# Patient Record
Sex: Female | Born: 1980 | Race: Asian | Hispanic: No | Marital: Married | Smoking: Never smoker
Health system: Southern US, Community
[De-identification: ages and names within clinical notes are randomized; demographics above are authoritative.]

## PROBLEM LIST (undated history)

## (undated) DIAGNOSIS — Z789 Other specified health status: Secondary | ICD-10-CM

## (undated) HISTORY — PX: NO PAST SURGERIES: SHX2092

---

## 2014-04-05 ENCOUNTER — Telehealth: Payer: Self-pay | Admitting: Neurology

## 2014-04-06 NOTE — Telephone Encounter (Signed)
error 

## 2014-04-29 ENCOUNTER — Telehealth: Payer: Self-pay | Admitting: Neurology

## 2014-04-29 ENCOUNTER — Encounter: Payer: Self-pay | Admitting: *Deleted

## 2014-04-29 NOTE — Telephone Encounter (Signed)
Pt states that she is returning Dr Allena KatzPatel call about MRI results please call [phone]

## 2014-04-29 NOTE — Telephone Encounter (Signed)
Patient calling you back 

## 2014-04-29 NOTE — Telephone Encounter (Signed)
Incorrect patient chart

## 2014-04-29 NOTE — Telephone Encounter (Signed)
This encounter was created in error - please disregard.

## 2015-02-10 MED FILL — LO LOESTRIN FE 1-10 TABLET: 1 MG-10 MCG | 84 days supply | Qty: 84 | Fill #1

## 2015-04-28 MED FILL — LO LOESTRIN FE 1-10 TABLET: 1 MG-10 MCG | 84 days supply | Qty: 84 | Fill #2

## 2015-08-11 MED FILL — LO LOESTRIN FE 1-10 TABLET: 1 MG-10 MCG | 84 days supply | Qty: 84 | Fill #3

## 2015-11-23 DIAGNOSIS — Z319 Encounter for procreative management, unspecified: Secondary | ICD-10-CM | POA: Diagnosis not present

## 2015-11-23 DIAGNOSIS — Z6822 Body mass index (BMI) 22.0-22.9, adult: Secondary | ICD-10-CM | POA: Diagnosis not present

## 2015-11-23 DIAGNOSIS — Z3169 Encounter for other general counseling and advice on procreation: Secondary | ICD-10-CM | POA: Diagnosis not present

## 2015-11-23 DIAGNOSIS — Z01419 Encounter for gynecological examination (general) (routine) without abnormal findings: Secondary | ICD-10-CM | POA: Diagnosis not present

## 2015-11-24 MED FILL — LO LOESTRIN FE 1-10 TABLET: 1 MG-10 MCG | 84 days supply | Qty: 84 | Fill #0

## 2015-11-28 ENCOUNTER — Telehealth: Payer: Self-pay | Admitting: Neurology

## 2015-11-28 NOTE — Telephone Encounter (Signed)
Sorry it is on patient [phone]

## 2015-11-28 NOTE — Telephone Encounter (Signed)
What patient?

## 2015-11-28 NOTE — Telephone Encounter (Signed)
Patient wants to know the status of the Lumbar puncture please call [phone]

## 2016-02-28 MED FILL — LO LOESTRIN FE 1-10 TABLET: 1 MG-10 MCG | 84 days supply | Qty: 84 | Fill #1

## 2016-04-03 DIAGNOSIS — J45990 Exercise induced bronchospasm: Secondary | ICD-10-CM | POA: Diagnosis not present

## 2016-04-03 DIAGNOSIS — Z23 Encounter for immunization: Secondary | ICD-10-CM | POA: Diagnosis not present

## 2016-04-03 DIAGNOSIS — Z0000 Encounter for general adult medical examination without abnormal findings: Secondary | ICD-10-CM | POA: Diagnosis not present

## 2016-05-17 MED FILL — LO LOESTRIN FE 1-10 TABLET: 1 MG-10 MCG | 84 days supply | Qty: 84 | Fill #2

## 2016-08-07 MED FILL — LO LOESTRIN FE 1-10 TABLET: 1 MG-10 MCG | 84 days supply | Qty: 84 | Fill #3

## 2017-05-08 DIAGNOSIS — H52203 Unspecified astigmatism, bilateral: Secondary | ICD-10-CM | POA: Diagnosis not present

## 2017-05-08 DIAGNOSIS — H5213 Myopia, bilateral: Secondary | ICD-10-CM | POA: Diagnosis not present

## 2017-05-29 DIAGNOSIS — Z6822 Body mass index (BMI) 22.0-22.9, adult: Secondary | ICD-10-CM | POA: Diagnosis not present

## 2017-05-29 DIAGNOSIS — Z01419 Encounter for gynecological examination (general) (routine) without abnormal findings: Secondary | ICD-10-CM | POA: Diagnosis not present

## 2017-09-23 ENCOUNTER — Telehealth: Payer: Self-pay | Admitting: Neurology

## 2018-03-05 DIAGNOSIS — N979 Female infertility, unspecified: Secondary | ICD-10-CM | POA: Diagnosis not present

## 2018-03-05 MED FILL — CLOMIPHENE CITRATE 50 MG TA: 50 | 5 days supply | Qty: 5 | Fill #0

## 2018-03-17 MED FILL — CLOMIPHENE CITRATE 50 MG TA: 50 | 5 days supply | Qty: 5 | Fill #0

## 2018-03-18 ENCOUNTER — Other Ambulatory Visit: Payer: Self-pay | Admitting: Obstetrics and Gynecology

## 2018-03-18 ENCOUNTER — Other Ambulatory Visit (HOSPITAL_COMMUNITY): Payer: Self-pay | Admitting: Obstetrics and Gynecology

## 2018-03-18 DIAGNOSIS — Z3141 Encounter for fertility testing: Secondary | ICD-10-CM

## 2018-03-26 ENCOUNTER — Encounter (HOSPITAL_COMMUNITY): Payer: Self-pay | Admitting: Radiology

## 2018-03-26 ENCOUNTER — Ambulatory Visit (HOSPITAL_COMMUNITY)
Admission: RE | Admit: 2018-03-26 | Discharge: 2018-03-26 | Disposition: A | Discharge status: Home / Self Care | Payer: 59 | Source: Ambulatory Visit | Attending: Obstetrics and Gynecology | Admitting: Obstetrics and Gynecology

## 2018-03-26 DIAGNOSIS — Z3141 Encounter for fertility testing: Secondary | ICD-10-CM | POA: Diagnosis not present

## 2018-03-26 MED ORDER — IOPAMIDOL (ISOVUE-300) INJECTION 61%
50.0000 mL | Freq: Once | INTRAVENOUS | Status: AC | PRN
  Administered 2018-03-26: 20 mL via INTRAVENOUS

## 2018-03-28 DIAGNOSIS — N979 Female infertility, unspecified: Secondary | ICD-10-CM | POA: Diagnosis not present

## 2018-04-07 DIAGNOSIS — N979 Female infertility, unspecified: Secondary | ICD-10-CM | POA: Diagnosis not present

## 2018-04-15 MED FILL — CLOMIPHENE CITRATE 50 MG TA: 50 | 5 days supply | Qty: 10 | Fill #0

## 2018-04-21 DIAGNOSIS — L723 Sebaceous cyst: Secondary | ICD-10-CM | POA: Diagnosis not present

## 2018-04-23 DIAGNOSIS — N83201 Unspecified ovarian cyst, right side: Secondary | ICD-10-CM | POA: Diagnosis not present

## 2018-04-23 DIAGNOSIS — N979 Female infertility, unspecified: Secondary | ICD-10-CM | POA: Diagnosis not present

## 2018-04-28 DIAGNOSIS — N979 Female infertility, unspecified: Secondary | ICD-10-CM | POA: Diagnosis not present

## 2018-05-02 DIAGNOSIS — N979 Female infertility, unspecified: Secondary | ICD-10-CM | POA: Diagnosis not present

## 2018-05-23 DIAGNOSIS — N979 Female infertility, unspecified: Secondary | ICD-10-CM | POA: Diagnosis not present

## 2018-06-02 DIAGNOSIS — N979 Female infertility, unspecified: Secondary | ICD-10-CM | POA: Diagnosis not present

## 2018-06-18 DIAGNOSIS — E288 Other ovarian dysfunction: Secondary | ICD-10-CM | POA: Diagnosis not present

## 2018-06-18 DIAGNOSIS — Z31430 Encounter of female for testing for genetic disease carrier status for procreative management: Secondary | ICD-10-CM | POA: Diagnosis not present

## 2018-06-18 DIAGNOSIS — N979 Female infertility, unspecified: Secondary | ICD-10-CM | POA: Diagnosis not present

## 2018-06-18 DIAGNOSIS — Z319 Encounter for procreative management, unspecified: Secondary | ICD-10-CM | POA: Diagnosis not present

## 2018-06-18 DIAGNOSIS — N83202 Unspecified ovarian cyst, left side: Secondary | ICD-10-CM | POA: Diagnosis not present

## 2018-06-18 DIAGNOSIS — N83201 Unspecified ovarian cyst, right side: Secondary | ICD-10-CM | POA: Diagnosis not present

## 2018-06-25 DIAGNOSIS — E288 Other ovarian dysfunction: Secondary | ICD-10-CM | POA: Diagnosis not present

## 2018-06-25 DIAGNOSIS — N979 Female infertility, unspecified: Secondary | ICD-10-CM | POA: Diagnosis not present

## 2018-06-25 DIAGNOSIS — Z3202 Encounter for pregnancy test, result negative: Secondary | ICD-10-CM | POA: Diagnosis not present

## 2018-06-25 DIAGNOSIS — Z3141 Encounter for fertility testing: Secondary | ICD-10-CM | POA: Diagnosis not present

## 2018-06-25 DIAGNOSIS — Z319 Encounter for procreative management, unspecified: Secondary | ICD-10-CM | POA: Diagnosis not present

## 2018-06-25 DIAGNOSIS — Z31430 Encounter of female for testing for genetic disease carrier status for procreative management: Secondary | ICD-10-CM | POA: Diagnosis not present

## 2018-07-16 DIAGNOSIS — Z31430 Encounter of female for testing for genetic disease carrier status for procreative management: Secondary | ICD-10-CM | POA: Diagnosis not present

## 2018-07-16 DIAGNOSIS — E288 Other ovarian dysfunction: Secondary | ICD-10-CM | POA: Diagnosis not present

## 2018-07-16 DIAGNOSIS — Z319 Encounter for procreative management, unspecified: Secondary | ICD-10-CM | POA: Diagnosis not present

## 2018-07-16 DIAGNOSIS — N979 Female infertility, unspecified: Secondary | ICD-10-CM | POA: Diagnosis not present

## 2018-07-23 DIAGNOSIS — E2839 Other primary ovarian failure: Secondary | ICD-10-CM | POA: Diagnosis not present

## 2018-07-23 DIAGNOSIS — N83292 Other ovarian cyst, left side: Secondary | ICD-10-CM | POA: Diagnosis not present

## 2018-07-23 DIAGNOSIS — N83291 Other ovarian cyst, right side: Secondary | ICD-10-CM | POA: Diagnosis not present

## 2018-07-23 DIAGNOSIS — Z113 Encounter for screening for infections with a predominantly sexual mode of transmission: Secondary | ICD-10-CM | POA: Diagnosis not present

## 2018-07-29 MED FILL — BD NEEDLES 22GX1.5": 22G X 1-1/2 | 30 days supply | Qty: 30 | Fill #0

## 2018-07-29 MED FILL — BD NEEDLES 30GX0.5": 30G X 1/2" | 30 days supply | Qty: 30 | Fill #0

## 2018-07-29 MED FILL — BD NEEDLES 22GX1.5: 22G X 1-1/2 | 30 days supply | Qty: 30 | Fill #0

## 2018-07-29 MED FILL — ESTRADIOL 2 MG TABLET: 2 | 30 days supply | Qty: 60 | Fill #0

## 2018-07-29 MED FILL — BD 3 ML SYRINGE 18GX1-1/2": 18G X 1-1/2 | 30 days supply | Qty: 60 | Fill #0

## 2018-07-29 MED FILL — ESTRADIOL 0.1 MG PATCH: 0.1 | 28 days supply | Qty: 8 | Fill #0

## 2018-07-29 MED FILL — BD NEEDLES 30GX0.5: 30G X 1/2" | 30 days supply | Qty: 30 | Fill #0

## 2018-07-29 MED FILL — GONAL-F RFF REDI-JECT 900 U: 900 | 4 days supply | Qty: 6 | Fill #0

## 2018-07-29 MED FILL — BD 3 ML SYRINGE 18GX1-1/2: 18G X 1-1/2 | 30 days supply | Qty: 60 | Fill #0

## 2018-07-29 MED FILL — CETROTIDE 0.25 MG KIT: 0.25 | 5 days supply | Qty: 5 | Fill #0

## 2018-07-29 MED FILL — NOVAREL 10,000 UNITS VIAL: 10000 | 1 days supply | Qty: 1 | Fill #0

## 2018-07-29 MED FILL — PROGESTERONE OIL 50 MG/ML V: 50 | 30 days supply | Qty: 30 | Fill #0

## 2018-07-29 MED FILL — METHYLPREDNISOLONE 4 MG TAB: 4 | 4 days supply | Qty: 16 | Fill #0

## 2018-07-29 MED FILL — DOXYCYCLINE HYCLATE 100 MG: 100 | 20 days supply | Qty: 40 | Fill #0

## 2018-07-29 MED FILL — MENOPUR 75 UNIT VIAL: 75 | 10 days supply | Qty: 10 | Fill #0

## 2018-08-09 DIAGNOSIS — Z31430 Encounter of female for testing for genetic disease carrier status for procreative management: Secondary | ICD-10-CM | POA: Diagnosis not present

## 2018-08-09 DIAGNOSIS — N83201 Unspecified ovarian cyst, right side: Secondary | ICD-10-CM | POA: Diagnosis not present

## 2018-08-09 DIAGNOSIS — Z3202 Encounter for pregnancy test, result negative: Secondary | ICD-10-CM | POA: Diagnosis not present

## 2018-08-09 DIAGNOSIS — N979 Female infertility, unspecified: Secondary | ICD-10-CM | POA: Diagnosis not present

## 2018-08-09 DIAGNOSIS — N83291 Other ovarian cyst, right side: Secondary | ICD-10-CM | POA: Diagnosis not present

## 2018-08-09 DIAGNOSIS — E288 Other ovarian dysfunction: Secondary | ICD-10-CM | POA: Diagnosis not present

## 2018-08-09 DIAGNOSIS — Z319 Encounter for procreative management, unspecified: Secondary | ICD-10-CM | POA: Diagnosis not present

## 2018-08-09 DIAGNOSIS — N971 Female infertility of tubal origin: Secondary | ICD-10-CM | POA: Diagnosis not present

## 2018-08-09 DIAGNOSIS — Z3183 Encounter for assisted reproductive fertility procedure cycle: Secondary | ICD-10-CM | POA: Diagnosis not present

## 2018-08-09 DIAGNOSIS — N83292 Other ovarian cyst, left side: Secondary | ICD-10-CM | POA: Diagnosis not present

## 2018-08-13 DIAGNOSIS — Z3183 Encounter for assisted reproductive fertility procedure cycle: Secondary | ICD-10-CM | POA: Diagnosis not present

## 2018-08-13 DIAGNOSIS — N979 Female infertility, unspecified: Secondary | ICD-10-CM | POA: Diagnosis not present

## 2018-08-13 DIAGNOSIS — E288 Other ovarian dysfunction: Secondary | ICD-10-CM | POA: Diagnosis not present

## 2018-08-16 DIAGNOSIS — Z3183 Encounter for assisted reproductive fertility procedure cycle: Secondary | ICD-10-CM | POA: Diagnosis not present

## 2018-08-16 DIAGNOSIS — N979 Female infertility, unspecified: Secondary | ICD-10-CM | POA: Diagnosis not present

## 2018-08-16 DIAGNOSIS — E288 Other ovarian dysfunction: Secondary | ICD-10-CM | POA: Diagnosis not present

## 2018-08-18 DIAGNOSIS — Z3183 Encounter for assisted reproductive fertility procedure cycle: Secondary | ICD-10-CM | POA: Diagnosis not present

## 2018-08-18 DIAGNOSIS — N979 Female infertility, unspecified: Secondary | ICD-10-CM | POA: Diagnosis not present

## 2018-08-18 DIAGNOSIS — E288 Other ovarian dysfunction: Secondary | ICD-10-CM | POA: Diagnosis not present

## 2018-08-18 MED FILL — MENOPUR 75 UNIT VIAL: 75 | 10 days supply | Qty: 10 | Fill #1

## 2018-08-18 MED FILL — GONAL-F RFF REDI-JECT 900 U: 900 | 4 days supply | Qty: 6 | Fill #1

## 2018-08-20 DIAGNOSIS — N979 Female infertility, unspecified: Secondary | ICD-10-CM | POA: Diagnosis not present

## 2018-08-20 DIAGNOSIS — E288 Other ovarian dysfunction: Secondary | ICD-10-CM | POA: Diagnosis not present

## 2018-08-20 DIAGNOSIS — Z3183 Encounter for assisted reproductive fertility procedure cycle: Secondary | ICD-10-CM | POA: Diagnosis not present

## 2018-08-20 DIAGNOSIS — Z113 Encounter for screening for infections with a predominantly sexual mode of transmission: Secondary | ICD-10-CM | POA: Diagnosis not present

## 2018-08-21 DIAGNOSIS — E288 Other ovarian dysfunction: Secondary | ICD-10-CM | POA: Diagnosis not present

## 2018-08-21 DIAGNOSIS — Z3183 Encounter for assisted reproductive fertility procedure cycle: Secondary | ICD-10-CM | POA: Diagnosis not present

## 2018-08-21 DIAGNOSIS — N979 Female infertility, unspecified: Secondary | ICD-10-CM | POA: Diagnosis not present

## 2018-08-22 NOTE — Telephone Encounter (Signed)
error 

## 2018-08-23 DIAGNOSIS — N858 Other specified noninflammatory disorders of uterus: Secondary | ICD-10-CM | POA: Diagnosis not present

## 2018-08-23 DIAGNOSIS — N971 Female infertility of tubal origin: Secondary | ICD-10-CM | POA: Diagnosis not present

## 2018-08-23 DIAGNOSIS — Z3183 Encounter for assisted reproductive fertility procedure cycle: Secondary | ICD-10-CM | POA: Diagnosis not present

## 2018-08-28 DIAGNOSIS — Z3183 Encounter for assisted reproductive fertility procedure cycle: Secondary | ICD-10-CM | POA: Diagnosis not present

## 2018-09-16 MED FILL — ESTRADIOL 0.1 MG PATCH: 0.1 | 28 days supply | Qty: 8 | Fill #1

## 2018-09-17 DIAGNOSIS — Z113 Encounter for screening for infections with a predominantly sexual mode of transmission: Secondary | ICD-10-CM | POA: Diagnosis not present

## 2018-09-17 DIAGNOSIS — N979 Female infertility, unspecified: Secondary | ICD-10-CM | POA: Diagnosis not present

## 2018-09-17 DIAGNOSIS — Z3183 Encounter for assisted reproductive fertility procedure cycle: Secondary | ICD-10-CM | POA: Diagnosis not present

## 2018-09-17 DIAGNOSIS — E288 Other ovarian dysfunction: Secondary | ICD-10-CM | POA: Diagnosis not present

## 2018-09-24 DIAGNOSIS — Z3183 Encounter for assisted reproductive fertility procedure cycle: Secondary | ICD-10-CM | POA: Diagnosis not present

## 2018-09-29 MED FILL — ESTRADIOL 2 MG TABS: 2 | 30 days supply | Qty: 60 | Fill #1

## 2018-10-02 DIAGNOSIS — Z3200 Encounter for pregnancy test, result unknown: Secondary | ICD-10-CM | POA: Diagnosis not present

## 2018-10-02 DIAGNOSIS — Z3201 Encounter for pregnancy test, result positive: Secondary | ICD-10-CM | POA: Diagnosis not present

## 2018-10-06 DIAGNOSIS — Z3201 Encounter for pregnancy test, result positive: Secondary | ICD-10-CM | POA: Diagnosis not present

## 2018-10-06 DIAGNOSIS — Z3200 Encounter for pregnancy test, result unknown: Secondary | ICD-10-CM | POA: Diagnosis not present

## 2018-10-07 MED FILL — BD NEEDLES 22GX1.5: 22G X 1-1/2 | 30 days supply | Qty: 30 | Fill #1

## 2018-10-07 MED FILL — PROGESTERONE OIL 50 MG/ML V: 50 | 30 days supply | Qty: 30 | Fill #1

## 2018-10-08 DIAGNOSIS — H5213 Myopia, bilateral: Secondary | ICD-10-CM | POA: Diagnosis not present

## 2018-10-08 MED FILL — ESTRADIOL 0.1 MG PATCH: 0.1 | 28 days supply | Qty: 8 | Fill #2

## 2018-10-21 DIAGNOSIS — Z3200 Encounter for pregnancy test, result unknown: Secondary | ICD-10-CM | POA: Diagnosis not present

## 2018-11-04 MED FILL — PROGESTERONE MICRONIZED 200: 200 | 14 days supply | Qty: 14 | Fill #0

## 2018-11-13 DIAGNOSIS — Z3481 Encounter for supervision of other normal pregnancy, first trimester: Secondary | ICD-10-CM | POA: Diagnosis not present

## 2018-11-13 DIAGNOSIS — Z3685 Encounter for antenatal screening for Streptococcus B: Secondary | ICD-10-CM | POA: Diagnosis not present

## 2018-11-18 DIAGNOSIS — O09511 Supervision of elderly primigravida, first trimester: Secondary | ICD-10-CM | POA: Diagnosis not present

## 2018-11-18 DIAGNOSIS — Z113 Encounter for screening for infections with a predominantly sexual mode of transmission: Secondary | ICD-10-CM | POA: Diagnosis not present

## 2018-11-18 DIAGNOSIS — Z3401 Encounter for supervision of normal first pregnancy, first trimester: Secondary | ICD-10-CM | POA: Diagnosis not present

## 2018-12-03 DIAGNOSIS — Z3682 Encounter for antenatal screening for nuchal translucency: Secondary | ICD-10-CM | POA: Diagnosis not present

## 2018-12-03 DIAGNOSIS — Z3A12 12 weeks gestation of pregnancy: Secondary | ICD-10-CM | POA: Diagnosis not present

## 2019-01-14 DIAGNOSIS — Z361 Encounter for antenatal screening for raised alphafetoprotein level: Secondary | ICD-10-CM | POA: Diagnosis not present

## 2019-01-14 DIAGNOSIS — Z3402 Encounter for supervision of normal first pregnancy, second trimester: Secondary | ICD-10-CM | POA: Diagnosis not present

## 2019-01-14 DIAGNOSIS — Z363 Encounter for antenatal screening for malformations: Secondary | ICD-10-CM | POA: Diagnosis not present

## 2019-01-14 DIAGNOSIS — Z3A18 18 weeks gestation of pregnancy: Secondary | ICD-10-CM | POA: Diagnosis not present

## 2019-03-11 DIAGNOSIS — Z0000 Encounter for general adult medical examination without abnormal findings: Secondary | ICD-10-CM | POA: Diagnosis not present

## 2019-03-11 DIAGNOSIS — Z331 Pregnant state, incidental: Secondary | ICD-10-CM | POA: Diagnosis not present

## 2019-03-11 DIAGNOSIS — J45990 Exercise induced bronchospasm: Secondary | ICD-10-CM | POA: Diagnosis not present

## 2019-03-19 DIAGNOSIS — Z3480 Encounter for supervision of other normal pregnancy, unspecified trimester: Secondary | ICD-10-CM | POA: Diagnosis not present

## 2019-03-19 DIAGNOSIS — Z3482 Encounter for supervision of other normal pregnancy, second trimester: Secondary | ICD-10-CM | POA: Diagnosis not present

## 2019-03-19 DIAGNOSIS — Z23 Encounter for immunization: Secondary | ICD-10-CM | POA: Diagnosis not present

## 2019-03-30 DIAGNOSIS — O99810 Abnormal glucose complicating pregnancy: Secondary | ICD-10-CM | POA: Diagnosis not present

## 2019-04-30 DIAGNOSIS — N760 Acute vaginitis: Secondary | ICD-10-CM | POA: Diagnosis not present

## 2019-04-30 DIAGNOSIS — Z3400 Encounter for supervision of normal first pregnancy, unspecified trimester: Secondary | ICD-10-CM | POA: Diagnosis not present

## 2019-05-14 DIAGNOSIS — Z3685 Encounter for antenatal screening for Streptococcus B: Secondary | ICD-10-CM | POA: Diagnosis not present

## 2019-05-14 LAB — OB RESULTS CONSOLE GBS: GBS: NEGATIVE

## 2019-05-31 ENCOUNTER — Inpatient Hospital Stay (HOSPITAL_COMMUNITY): Payer: 59 | Admitting: Anesthesiology

## 2019-05-31 ENCOUNTER — Inpatient Hospital Stay (HOSPITAL_COMMUNITY)
Admission: AD | Admit: 2019-05-31 | Discharge: 2019-06-04 | DRG: 786 | Disposition: A | Discharge status: Home / Self Care | Payer: 59 | Attending: Obstetrics and Gynecology | Admitting: Obstetrics and Gynecology

## 2019-05-31 ENCOUNTER — Other Ambulatory Visit: Payer: Self-pay

## 2019-05-31 ENCOUNTER — Encounter (HOSPITAL_COMMUNITY): Payer: Self-pay | Admitting: Obstetrics & Gynecology

## 2019-05-31 DIAGNOSIS — Z3A38 38 weeks gestation of pregnancy: Secondary | ICD-10-CM

## 2019-05-31 DIAGNOSIS — O4292 Full-term premature rupture of membranes, unspecified as to length of time between rupture and onset of labor: Secondary | ICD-10-CM | POA: Diagnosis present

## 2019-05-31 DIAGNOSIS — Z20822 Contact with and (suspected) exposure to covid-19: Secondary | ICD-10-CM | POA: Diagnosis present

## 2019-05-31 DIAGNOSIS — O411230 Chorioamnionitis, third trimester, not applicable or unspecified: Secondary | ICD-10-CM | POA: Diagnosis not present

## 2019-05-31 HISTORY — DX: Other specified health status: Z78.9

## 2019-05-31 LAB — CBC
HCT: 38.6 % (ref 36.0–46.0)
Hemoglobin: 12.4 g/dL (ref 12.0–15.0)
MCH: 30.4 pg (ref 26.0–34.0)
MCHC: 32.1 g/dL (ref 30.0–36.0)
MCV: 94.6 fL (ref 80.0–100.0)
Platelets: 172 10*3/uL (ref 150–400)
RBC: 4.08 MIL/uL (ref 3.87–5.11)
RDW: 13.1 % (ref 11.5–15.5)
WBC: 10.7 10*3/uL — ABNORMAL HIGH (ref 4.0–10.5)
nRBC: 0 % (ref 0.0–0.2)

## 2019-05-31 LAB — ABO/RH: ABO/RH(D): O POS

## 2019-05-31 LAB — TYPE AND SCREEN
ABO/RH(D): O POS
Antibody Screen: NEGATIVE

## 2019-05-31 LAB — SARS CORONAVIRUS 2 (TAT 6-24 HRS): SARS Coronavirus 2: NEGATIVE

## 2019-05-31 LAB — AMNISURE RUPTURE OF MEMBRANE (ROM) NOT AT ARMC: Amnisure ROM: POSITIVE

## 2019-05-31 LAB — POCT FERN TEST: POCT Fern Test: NEGATIVE

## 2019-05-31 MED ORDER — OXYCODONE-ACETAMINOPHEN 5-325 MG PO TABS: 1.0000 | ORAL_TABLET | ORAL | Status: DC | PRN

## 2019-05-31 MED ORDER — LACTATED RINGERS IV SOLN
500.0000 mL | Freq: Once | INTRAVENOUS | Status: AC
  Administered 2019-05-31: 500 mL via INTRAVENOUS

## 2019-05-31 MED ORDER — ONDANSETRON HCL 4 MG/2ML IJ SOLN
4.0000 mg | Freq: Four times a day (QID) | INTRAMUSCULAR | Status: DC | PRN
  Administered 2019-06-01 (×2): 4 mg via INTRAVENOUS
  Filled 2019-05-31: qty 2

## 2019-05-31 MED ORDER — LIDOCAINE HCL (PF) 1 % IJ SOLN: 30.0000 mL | INTRAMUSCULAR | Status: DC | PRN

## 2019-05-31 MED ORDER — TERBUTALINE SULFATE 1 MG/ML IJ SOLN: 0.2500 mg | Freq: Once | INTRAMUSCULAR | Status: DC | PRN

## 2019-05-31 MED ORDER — OXYTOCIN 40 UNITS IN NORMAL SALINE INFUSION - SIMPLE MED
2.5000 [IU]/h | INTRAVENOUS | Status: DC
  Administered 2019-06-01: 10:00:00 40 [IU]/h via INTRAVENOUS

## 2019-05-31 MED ORDER — SOD CITRATE-CITRIC ACID 500-334 MG/5ML PO SOLN
30.0000 mL | ORAL | Status: DC | PRN
  Administered 2019-06-01: 30 mL via ORAL
  Filled 2019-05-31: qty 30

## 2019-05-31 MED ORDER — LACTATED RINGERS IV SOLN: 500.0000 mL | INTRAVENOUS | Status: DC | PRN

## 2019-05-31 MED ORDER — DIPHENHYDRAMINE HCL 50 MG/ML IJ SOLN: 12.5000 mg | INTRAMUSCULAR | Status: DC | PRN

## 2019-05-31 MED ORDER — FENTANYL-BUPIVACAINE-NACL 0.5-0.125-0.9 MG/250ML-% EP SOLN
12.0000 mL/h | EPIDURAL | Status: DC | PRN
  Filled 2019-05-31: qty 250

## 2019-05-31 MED ORDER — LIDOCAINE HCL (PF) 1 % IJ SOLN
INTRAMUSCULAR | Status: DC | PRN
  Administered 2019-05-31: 7 mL via EPIDURAL
  Administered 2019-05-31: 3 mL via EPIDURAL

## 2019-05-31 MED ORDER — OXYCODONE-ACETAMINOPHEN 5-325 MG PO TABS: 2.0000 | ORAL_TABLET | ORAL | Status: DC | PRN

## 2019-05-31 MED ORDER — LACTATED RINGERS IV SOLN: INTRAVENOUS | Status: DC

## 2019-05-31 MED ORDER — OXYTOCIN BOLUS FROM INFUSION: 500.0000 mL | Freq: Once | INTRAVENOUS | Status: DC

## 2019-05-31 MED ORDER — ACETAMINOPHEN 325 MG PO TABS
650.0000 mg | ORAL_TABLET | ORAL | Status: DC | PRN
  Administered 2019-06-01: 650 mg via ORAL
  Filled 2019-05-31: qty 2

## 2019-05-31 MED ORDER — PHENYLEPHRINE 40 MCG/ML (10ML) SYRINGE FOR IV PUSH (FOR BLOOD PRESSURE SUPPORT): 80.0000 ug | PREFILLED_SYRINGE | INTRAVENOUS | Status: DC | PRN

## 2019-05-31 MED ORDER — PHENYLEPHRINE 40 MCG/ML (10ML) SYRINGE FOR IV PUSH (FOR BLOOD PRESSURE SUPPORT)
80.0000 ug | PREFILLED_SYRINGE | INTRAVENOUS | Status: DC | PRN
  Filled 2019-05-31: qty 10

## 2019-05-31 MED ORDER — EPHEDRINE 5 MG/ML INJ: 10.0000 mg | INTRAVENOUS | Status: DC | PRN

## 2019-05-31 MED ORDER — SODIUM CHLORIDE (PF) 0.9 % IJ SOLN
INTRAMUSCULAR | Status: DC | PRN
  Administered 2019-05-31: 12 mL/h via EPIDURAL

## 2019-05-31 MED ORDER — OXYTOCIN 40 UNITS IN NORMAL SALINE INFUSION - SIMPLE MED
1.0000 m[IU]/min | INTRAVENOUS | Status: DC
  Administered 2019-05-31: 2 m[IU]/min via INTRAVENOUS
  Filled 2019-05-31: qty 1000

## 2019-05-31 MED ORDER — FLEET ENEMA 7-19 GM/118ML RE ENEM: 1.0000 | ENEMA | RECTAL | Status: DC | PRN

## 2019-05-31 NOTE — Progress Notes (Signed)
SVE 3/80/-2, AROM forbag clear Will start pitocin.    Mitchel Honour, DO

## 2019-05-31 NOTE — MAU Note (Signed)
Elizabeth Cannon is a 39 y.o. at [redacted]w[redacted]d here in MAU reporting: today has been wearing a pad and noticed some water. States no gush just a little bit "here and there". States it was clear. Having some tightening in her abdomen but states they are not painful. +FM. No recent IC.  Onset of complaint: today  Pain score: 0/10  Vitals:   05/31/19 1553  BP: 116/75  Pulse: 88  Resp: 16  Temp: 98.6 F (37 C)  SpO2: 98%     FHT: +FM  Lab orders placed from triage: none

## 2019-05-31 NOTE — Progress Notes (Signed)

## 2019-05-31 NOTE — Anesthesia Procedure Notes (Signed)
Epidural Patient location during procedure: OB Start time: 05/31/2019 7:33 PM End time: 05/31/2019 7:43 PM  Staffing Anesthesiologist: Lucretia Kern, MD Performed: anesthesiologist   Preanesthetic Checklist Completed: patient identified, IV checked, risks and benefits discussed, monitors and equipment checked, pre-op evaluation and timeout performed  Epidural Patient position: sitting Prep: DuraPrep Patient monitoring: heart rate, continuous pulse ox and blood pressure Approach: midline Location: L3-L4 Injection technique: LOR air  Needle:  Needle type: Tuohy  Needle gauge: 17 G Needle length: 9 cm Needle insertion depth: 6 cm Catheter type: closed end flexible Catheter size: 19 Gauge Catheter at skin depth: 11 cm Test dose: negative  Assessment Events: blood not aspirated, injection not painful, no injection resistance, no paresthesia and negative IV test  Additional Notes Reason for block:procedure for pain

## 2019-05-31 NOTE — Anesthesia Preprocedure Evaluation (Signed)

## 2019-05-31 NOTE — H&P (Signed)
Elizabeth Cannon is a 39 y.o. female G1 at [redacted]w[redacted]d by IVF presenting for PROM at 2 pm today.  She reports mild, rare CTX.  Active FM.  Patient is AMA with low risk NIPS.  GBS negative.  OB History    Gravida  1   Para      Term      Preterm      AB      Living        SAB      TAB      Ectopic      Multiple      Live Births             Past Medical History:  Diagnosis Date  . Medical history non-contributory    Past Surgical History:  Procedure Laterality Date  . NO PAST SURGERIES     Family History: family history is not on file. Social History:  reports that she has never smoked. She does not have any smokeless tobacco history on file. She reports that she does not drink alcohol or use drugs.     Maternal Diabetes: No Genetic Screening: Normal Maternal Ultrasounds/Referrals: Normal Fetal Ultrasounds or other Referrals:  None Maternal Substance Abuse:  No Significant Maternal Medications:  None Significant Maternal Lab Results:  Group B Strep negative Other Comments:  None  Review of Systems Maternal Medical History:  Reason for admission: Rupture of membranes.   Contractions: Onset was 3-5 hours ago.   Frequency: irregular.   Perceived severity is mild.    Fetal activity: Perceived fetal activity is normal.   Last perceived fetal movement was within the past hour.    Prenatal complications: no prenatal complications Prenatal Complications - Diabetes: none.      Blood pressure 116/75, pulse 88, temperature 98.6 F (37 C), temperature source Oral, resp. rate 16, height 5\' 3"  (1.6 m), weight 77.2 kg, SpO2 98 %. Maternal Exam:  Uterine Assessment: Contraction strength is mild.  Contraction frequency is irregular.   Abdomen: Patient reports no abdominal tenderness. Fundal height is c/w dates.   Estimated fetal weight is 7#.   Fetal presentation: vertex     Fetal Exam Fetal Monitor Review: Baseline rate: 140.  Variability: moderate (6-25 bpm).    Pattern: accelerations present and no decelerations.    Fetal State Assessment: Category I - tracings are normal.     Physical Exam  Constitutional: She is oriented to person, place, and time. She appears well-developed and well-nourished.  Respiratory: Effort normal.  GI: Soft. There is no rebound and no guarding.  Neurological: She is alert and oriented to person, place, and time.  Skin: Skin is warm and dry.  Psychiatric: She has a normal mood and affect. Her behavior is normal.    Prenatal labs: ABO, Rh:  O pos Antibody:  negative Rubella:  Immune RPR:   NR HBsAg:   Negative HIV:   NR GBS:   Negative  Assessment/Plan: 38yo G1 at [redacted]w[redacted]d with PROM -Pitocin -CLEA if desired -Anticipate NSVD  [redacted]w[redacted]d 05/31/2019, 5:48 PM

## 2019-05-31 NOTE — MAU Provider Note (Signed)
S: Ms. Elizabeth Cannon is a 39 y.o. G1P0 at [redacted]w[redacted]d  who presents to MAU today complaining of leaking of fluid since this morning. She endorses pink vaginal spotting. She denies contractions. She reports normal fetal movement.    O: BP 116/75 (BP Location: Right Arm)   Pulse 88   Temp 98.6 F (37 C) (Oral)   Resp 16   Ht 5\' 3"  (1.6 m)   Wt 77.2 kg   SpO2 98% Comment: room air  BMI 30.15 kg/m  GENERAL: Well-developed, well-nourished female in no acute distress.  HEAD: Normocephalic, atraumatic.  CHEST: Normal effort of breathing, regular heart rate ABDOMEN: Soft, nontender, gravid PELVIC: Normal external female genitalia. Vagina is pink and rugated. Cervix with normal contour, no lesions. Normal discharge.  neg pooling.   Cervical exam: pending RN exam and consult with MD     Fetal Monitoring: Baseline: 135 Variability: mod Accelerations: present Decelerations: absent Contractions: irregular, patient not feeling any contractions.   No results found for this or any previous visit (from the past 24 hour(s)).   A: [facility]UP at [redacted]w[redacted]d  SROM  Amnisure positive  P: Report given to RN to contact MD on call for further instructions  [redacted]w[redacted]d, CNM 05/31/2019 4:34 PM

## 2019-06-01 ENCOUNTER — Encounter (HOSPITAL_COMMUNITY)
Admission: AD | Disposition: A | Discharge status: Home / Self Care | Payer: Self-pay | Source: Home / Self Care | Attending: Obstetrics and Gynecology

## 2019-06-01 ENCOUNTER — Encounter (HOSPITAL_COMMUNITY): Payer: Self-pay | Admitting: Obstetrics & Gynecology

## 2019-06-01 LAB — RPR: RPR Ser Ql: NONREACTIVE

## 2019-06-01 SURGERY — Surgical Case
Anesthesia: Epidural

## 2019-06-01 MED ORDER — OXYCODONE HCL 5 MG PO TABS: 5.0000 mg | ORAL_TABLET | ORAL | Status: DC | PRN

## 2019-06-01 MED ORDER — DEXAMETHASONE SODIUM PHOSPHATE 10 MG/ML IJ SOLN
INTRAMUSCULAR | Status: DC | PRN
Start: 2019-06-01 — End: 2019-06-01
  Administered 2019-06-01: 10 mg via INTRAVENOUS

## 2019-06-01 MED ORDER — NALOXONE HCL 4 MG/10ML IJ SOLN
1.0000 ug/kg/h | INTRAVENOUS | Status: DC | PRN
  Filled 2019-06-01: qty 5

## 2019-06-01 MED ORDER — KETOROLAC TROMETHAMINE 30 MG/ML IJ SOLN
INTRAMUSCULAR | Status: AC
  Filled 2019-06-01: qty 1

## 2019-06-01 MED ORDER — MORPHINE SULFATE (PF) 0.5 MG/ML IJ SOLN
INTRAMUSCULAR | Status: DC | PRN
  Administered 2019-06-01: 3 mg via EPIDURAL

## 2019-06-01 MED ORDER — SIMETHICONE 80 MG PO CHEW
80.0000 mg | CHEWABLE_TABLET | Freq: Three times a day (TID) | ORAL | Status: DC
  Administered 2019-06-01 – 2019-06-04 (×6): 80 mg via ORAL
  Filled 2019-06-01 (×6): qty 1

## 2019-06-01 MED ORDER — OXYCODONE HCL 5 MG PO TABS: 5.0000 mg | ORAL_TABLET | Freq: Once | ORAL | Status: DC | PRN

## 2019-06-01 MED ORDER — HYDROMORPHONE HCL 1 MG/ML IJ SOLN: 0.2000 mg | INTRAMUSCULAR | Status: DC | PRN

## 2019-06-01 MED ORDER — FENTANYL CITRATE (PF) 100 MCG/2ML IJ SOLN
INTRAMUSCULAR | Status: DC | PRN
  Administered 2019-06-01: 100 ug via EPIDURAL

## 2019-06-01 MED ORDER — NALBUPHINE HCL 10 MG/ML IJ SOLN: 5.0000 mg | INTRAMUSCULAR | Status: DC | PRN

## 2019-06-01 MED ORDER — HYDROMORPHONE HCL 1 MG/ML IJ SOLN: 0.2500 mg | INTRAMUSCULAR | Status: DC | PRN

## 2019-06-01 MED ORDER — LACTATED RINGERS IV SOLN: INTRAVENOUS | Status: DC

## 2019-06-01 MED ORDER — DIBUCAINE (PERIANAL) 1 % EX OINT: 1.0000 "application " | TOPICAL_OINTMENT | CUTANEOUS | Status: DC | PRN

## 2019-06-01 MED ORDER — ZOLPIDEM TARTRATE 5 MG PO TABS: 5.0000 mg | ORAL_TABLET | Freq: Every evening | ORAL | Status: DC | PRN

## 2019-06-01 MED ORDER — OXYTOCIN 40 UNITS IN NORMAL SALINE INFUSION - SIMPLE MED: 2.5000 [IU]/h | INTRAVENOUS | Status: AC

## 2019-06-01 MED ORDER — GENTAMICIN SULFATE 40 MG/ML IJ SOLN
5.0000 mg/kg | INTRAVENOUS | Status: DC
  Administered 2019-06-01 – 2019-06-02 (×2): 310 mg via INTRAVENOUS
  Filled 2019-06-01 (×2): qty 7.75

## 2019-06-01 MED ORDER — COCONUT OIL OIL: 1.0000 "application " | TOPICAL_OIL | Status: DC | PRN

## 2019-06-01 MED ORDER — SIMETHICONE 80 MG PO CHEW: 80.0000 mg | CHEWABLE_TABLET | ORAL | Status: DC | PRN

## 2019-06-01 MED ORDER — KETOROLAC TROMETHAMINE 30 MG/ML IJ SOLN
30.0000 mg | Freq: Once | INTRAMUSCULAR | Status: AC | PRN
  Administered 2019-06-01: 11:00:00 30 mg via INTRAVENOUS

## 2019-06-01 MED ORDER — NALBUPHINE HCL 10 MG/ML IJ SOLN: 5.0000 mg | Freq: Once | INTRAMUSCULAR | Status: DC | PRN

## 2019-06-01 MED ORDER — CLINDAMYCIN PHOSPHATE 900 MG/50ML IV SOLN
900.0000 mg | Freq: Three times a day (TID) | INTRAVENOUS | Status: DC
  Administered 2019-06-01 – 2019-06-02 (×4): 900 mg via INTRAVENOUS
  Filled 2019-06-01 (×4): qty 50

## 2019-06-01 MED ORDER — FENTANYL CITRATE (PF) 100 MCG/2ML IJ SOLN
INTRAMUSCULAR | Status: AC
  Filled 2019-06-01: qty 2

## 2019-06-01 MED ORDER — MENTHOL 3 MG MT LOZG: 1.0000 | LOZENGE | OROMUCOSAL | Status: DC | PRN

## 2019-06-01 MED ORDER — ACETAMINOPHEN 500 MG PO TABS
1000.0000 mg | ORAL_TABLET | Freq: Four times a day (QID) | ORAL | Status: DC
  Administered 2019-06-01 – 2019-06-04 (×10): 1000 mg via ORAL
  Filled 2019-06-01 (×11): qty 2

## 2019-06-01 MED ORDER — DIPHENHYDRAMINE HCL 25 MG PO CAPS: 25.0000 mg | ORAL_CAPSULE | ORAL | Status: DC | PRN

## 2019-06-01 MED ORDER — SODIUM CHLORIDE 0.9 % IV SOLN
2.0000 g | Freq: Four times a day (QID) | INTRAVENOUS | Status: DC
  Administered 2019-06-01: 2 g via INTRAVENOUS
  Filled 2019-06-01 (×3): qty 2000

## 2019-06-01 MED ORDER — SODIUM CHLORIDE 0.9 % IV SOLN: 2.0000 g | Freq: Four times a day (QID) | INTRAVENOUS | Status: DC

## 2019-06-01 MED ORDER — MORPHINE SULFATE (PF) 0.5 MG/ML IJ SOLN
INTRAMUSCULAR | Status: AC
  Filled 2019-06-01: qty 10

## 2019-06-01 MED ORDER — SENNOSIDES-DOCUSATE SODIUM 8.6-50 MG PO TABS
2.0000 | ORAL_TABLET | ORAL | Status: DC
  Administered 2019-06-02 – 2019-06-03 (×3): 2 via ORAL
  Filled 2019-06-01 (×3): qty 2

## 2019-06-01 MED ORDER — LIDOCAINE-EPINEPHRINE (PF) 2 %-1:200000 IJ SOLN
INTRAMUSCULAR | Status: DC | PRN
  Administered 2019-06-01 (×2): 5 mL via EPIDURAL

## 2019-06-01 MED ORDER — SODIUM CHLORIDE 0.9 % IR SOLN
Status: DC | PRN
  Administered 2019-06-01: 1

## 2019-06-01 MED ORDER — DEXAMETHASONE SODIUM PHOSPHATE 10 MG/ML IJ SOLN
INTRAMUSCULAR | Status: AC
  Filled 2019-06-01: qty 1

## 2019-06-01 MED ORDER — IBUPROFEN 800 MG PO TABS
800.0000 mg | ORAL_TABLET | Freq: Four times a day (QID) | ORAL | Status: DC
  Administered 2019-06-02 – 2019-06-04 (×8): 800 mg via ORAL
  Filled 2019-06-01 (×8): qty 1

## 2019-06-01 MED ORDER — METOCLOPRAMIDE HCL 5 MG/ML IJ SOLN
INTRAMUSCULAR | Status: DC | PRN
Start: 2019-06-01 — End: 2019-06-01
  Administered 2019-06-01 (×2): 5 mg via INTRAVENOUS

## 2019-06-01 MED ORDER — SCOPOLAMINE 1 MG/3DAYS TD PT72
1.0000 | MEDICATED_PATCH | Freq: Once | TRANSDERMAL | Status: AC
  Administered 2019-06-01: 1.5 mg via TRANSDERMAL

## 2019-06-01 MED ORDER — KETOROLAC TROMETHAMINE 30 MG/ML IJ SOLN
30.0000 mg | Freq: Four times a day (QID) | INTRAMUSCULAR | Status: AC
  Administered 2019-06-01 – 2019-06-02 (×3): 30 mg via INTRAVENOUS
  Filled 2019-06-01 (×3): qty 1

## 2019-06-01 MED ORDER — DIPHENHYDRAMINE HCL 50 MG/ML IJ SOLN: 12.5000 mg | INTRAMUSCULAR | Status: DC | PRN

## 2019-06-01 MED ORDER — GENTAMICIN SULFATE 40 MG/ML IJ SOLN: 5.0000 mg/kg | INTRAVENOUS | Status: DC

## 2019-06-01 MED ORDER — PRENATAL MULTIVITAMIN CH
1.0000 | ORAL_TABLET | Freq: Every day | ORAL | Status: DC
  Administered 2019-06-02 – 2019-06-03 (×2): 1 via ORAL
  Filled 2019-06-01 (×2): qty 1

## 2019-06-01 MED ORDER — SCOPOLAMINE 1 MG/3DAYS TD PT72
MEDICATED_PATCH | TRANSDERMAL | Status: AC
  Filled 2019-06-01: qty 1

## 2019-06-01 MED ORDER — CLINDAMYCIN PHOSPHATE 900 MG/50ML IV SOLN
INTRAVENOUS | Status: AC
  Filled 2019-06-01: qty 50

## 2019-06-01 MED ORDER — SODIUM CHLORIDE 0.9% FLUSH: 3.0000 mL | INTRAVENOUS | Status: DC | PRN

## 2019-06-01 MED ORDER — DIPHENHYDRAMINE HCL 25 MG PO CAPS: 25.0000 mg | ORAL_CAPSULE | Freq: Four times a day (QID) | ORAL | Status: DC | PRN

## 2019-06-01 MED ORDER — SODIUM CHLORIDE 0.9 % IV SOLN: INTRAVENOUS | Status: DC | PRN

## 2019-06-01 MED ORDER — STERILE WATER FOR IRRIGATION IR SOLN
Status: DC | PRN
  Administered 2019-06-01: 1000 mL

## 2019-06-01 MED ORDER — SIMETHICONE 80 MG PO CHEW
80.0000 mg | CHEWABLE_TABLET | ORAL | Status: DC
  Administered 2019-06-02 – 2019-06-03 (×3): 80 mg via ORAL
  Filled 2019-06-01 (×3): qty 1

## 2019-06-01 MED ORDER — NALOXONE HCL 0.4 MG/ML IJ SOLN: 0.4000 mg | INTRAMUSCULAR | Status: DC | PRN

## 2019-06-01 MED ORDER — WITCH HAZEL-GLYCERIN EX PADS: 1.0000 "application " | MEDICATED_PAD | CUTANEOUS | Status: DC | PRN

## 2019-06-01 MED ORDER — TETANUS-DIPHTH-ACELL PERTUSSIS 5-2.5-18.5 LF-MCG/0.5 IM SUSP: 0.5000 mL | Freq: Once | INTRAMUSCULAR | Status: DC

## 2019-06-01 MED ORDER — ONDANSETRON HCL 4 MG/2ML IJ SOLN: 4.0000 mg | Freq: Three times a day (TID) | INTRAMUSCULAR | Status: DC | PRN

## 2019-06-01 MED ORDER — OXYCODONE HCL 5 MG/5ML PO SOLN: 5.0000 mg | Freq: Once | ORAL | Status: DC | PRN

## 2019-06-01 MED ORDER — OXYTOCIN 40 UNITS IN NORMAL SALINE INFUSION - SIMPLE MED
INTRAVENOUS | Status: AC
  Filled 2019-06-01: qty 1000

## 2019-06-01 MED ORDER — ONDANSETRON HCL 4 MG/2ML IJ SOLN: 4.0000 mg | Freq: Once | INTRAMUSCULAR | Status: DC | PRN

## 2019-06-01 MED ORDER — ONDANSETRON HCL 4 MG/2ML IJ SOLN
INTRAMUSCULAR | Status: AC
  Filled 2019-06-01: qty 2

## 2019-06-01 SURGICAL SUPPLY — 37 items
BENZOIN TINCTURE PRP APPL 2/3 (GAUZE/BANDAGES/DRESSINGS) ×3 IMPLANT
CHLORAPREP W/TINT 26ML (MISCELLANEOUS) ×3 IMPLANT
CLAMP CORD UMBIL (MISCELLANEOUS) IMPLANT
CLOSURE STERI-STRIP 1/2X4 (GAUZE/BANDAGES/DRESSINGS) ×1
CLOTH BEACON ORANGE TIMEOUT ST (SAFETY) ×3 IMPLANT
CLSR STERI-STRIP ANTIMIC 1/2X4 (GAUZE/BANDAGES/DRESSINGS) ×2 IMPLANT
DRSG OPSITE POSTOP 4X10 (GAUZE/BANDAGES/DRESSINGS) ×3 IMPLANT
ELECT REM PT RETURN 9FT ADLT (ELECTROSURGICAL) ×3
ELECTRODE REM PT RTRN 9FT ADLT (ELECTROSURGICAL) ×1 IMPLANT
EXTRACTOR VACUUM KIWI (MISCELLANEOUS) IMPLANT
GLOVE BIO SURGEON STRL SZ 6.5 (GLOVE) ×2 IMPLANT
GLOVE BIO SURGEONS STRL SZ 6.5 (GLOVE) ×1
GLOVE BIOGEL PI IND STRL 6.5 (GLOVE) ×1 IMPLANT
GLOVE BIOGEL PI IND STRL 7.0 (GLOVE) ×2 IMPLANT
GLOVE BIOGEL PI INDICATOR 6.5 (GLOVE) ×2
GLOVE BIOGEL PI INDICATOR 7.0 (GLOVE) ×4
GOWN STRL REUS W/TWL LRG LVL3 (GOWN DISPOSABLE) ×6 IMPLANT
KIT ABG SYR 3ML LUER SLIP (SYRINGE) ×3 IMPLANT
NEEDLE HYPO 25X5/8 SAFETYGLIDE (NEEDLE) ×3 IMPLANT
NS IRRIG 1000ML POUR BTL (IV SOLUTION) ×3 IMPLANT
PACK C SECTION WH (CUSTOM PROCEDURE TRAY) ×3 IMPLANT
PAD ABD 7.5X8 STRL (GAUZE/BANDAGES/DRESSINGS) ×3 IMPLANT
PAD OB MATERNITY 4.3X12.25 (PERSONAL CARE ITEMS) ×3 IMPLANT
PENCIL SMOKE EVAC W/HOLSTER (ELECTROSURGICAL) ×3 IMPLANT
SPONGE GAUZE 4X4 12PLY STER LF (GAUZE/BANDAGES/DRESSINGS) ×3 IMPLANT
SUT PLAIN 0 NONE (SUTURE) IMPLANT
SUT PLAIN 2 0 (SUTURE) ×2
SUT PLAIN ABS 2-0 CT1 27XMFL (SUTURE) ×1 IMPLANT
SUT VIC AB 0 CT1 36 (SUTURE) ×3 IMPLANT
SUT VIC AB 0 CTX 36 (SUTURE) ×4
SUT VIC AB 0 CTX36XBRD ANBCTRL (SUTURE) ×2 IMPLANT
SUT VIC AB 3-0 SH 27 (SUTURE) ×2
SUT VIC AB 3-0 SH 27X BRD (SUTURE) ×1 IMPLANT
SUT VIC AB 4-0 PS2 27 (SUTURE) ×3 IMPLANT
TOWEL OR 17X24 6PK STRL BLUE (TOWEL DISPOSABLE) ×3 IMPLANT
TRAY FOLEY W/BAG SLVR 14FR LF (SET/KITS/TRAYS/PACK) IMPLANT
WATER STERILE IRR 1000ML POUR (IV SOLUTION) ×3 IMPLANT

## 2019-06-01 NOTE — Progress Notes (Signed)
Patient pushing 3 hours. No further descent of head. Still +1 station. Recommend primary cesarean section for arrest of descent in the setting of chorioamnionitis. Clindamycin on call to OR given she has already had amp and gent. Risks discussed including infection, bleeding, damage to surrounding structures, the need for additional procedures including hysterectomy, and the possibility of uterine rupture with neonatal morbidity/mortality, scarring, and abnormal placentation with subsequent pregnancies. Patient agrees to proceed.     Rosie Fate MD

## 2019-06-01 NOTE — Progress Notes (Signed)
Elizabeth Cannon is a 39 y.o. G1P0 at [redacted]w[redacted]d by ultrasound admitted for rupture of membranes  Subjective: Comfortable with CLEA  Objective: BP 103/78   Pulse (!) 133   Temp 97.9 F (36.6 C) (Oral)   Resp 17   Ht 5\' 3"  (1.6 m)   Wt 77.2 kg   SpO2 98%   BMI 30.15 kg/m  No intake/output data recorded. No intake/output data recorded.  FHT:  FHR: 125 bpm, variability: moderate,  accelerations:  Present,  decelerations:  Absent UC:   regular, every 2-3 minutes SVE:   Dilation: 8 Effacement (%): 100 Station: 0 Exam by:: Dr. [email or web address]  Labs: Lab Results  Component Value Date   WBC 10.7 (H) 05/31/2019   HGB 12.4 05/31/2019   HCT 38.6 05/31/2019   MCV 94.6 05/31/2019   PLT 172 05/31/2019    Assessment / Plan: Induction of labor due to PROM,  progressing well on pitocin  Labor: Progressing normally Preeclampsia:  n/a Fetal Wellbeing:  Category I Pain Control:  Epidural I/D:  n/a Anticipated MOD:  NSVD  [name] 06/01/2019, 3:08 AM

## 2019-06-01 NOTE — Transfer of Care (Signed)
Immediate Anesthesia Transfer of Care Note  Patient: Elizabeth Cannon  Procedure(s) Performed: CESAREAN SECTION (N/A )  Patient Location: PACU  Anesthesia Type:Epidural  Level of Consciousness: awake, alert  and oriented  Airway & Oxygen Therapy: Patient Spontanous Breathing  Post-op Assessment: Report given to RN and Post -op Vital signs reviewed and stable  Post vital signs: Reviewed and stable  Last Vitals:  Vitals Value Taken Time  BP 95/61 06/01/19 1109  Temp    Pulse 106 06/01/19 1114  Resp 19 06/01/19 1114  SpO2 100 % 06/01/19 1114  Vitals shown include unvalidated device data.  Last Pain:  Vitals:   06/01/19 1109  TempSrc: (P) Oral  PainSc:          Complications: No apparent anesthesia complications

## 2019-06-01 NOTE — Progress Notes (Signed)
Temp 101.2 with mild maternal tachycardia noted. Suspect chorioamnionitis. Tylenol. Amp/Gent. D/w pt and husband. FHT category 1. Continue to push.     Rosie Fate MD

## 2019-06-01 NOTE — Op Note (Signed)
PROCEDURE DATE: 06/01/19  PREOPERATIVE DIAGNOSIS: Intrauterine pregnancy at 38.2 wga, Indication: arrest of descent, chorioamnionitis  POSTOPERATIVE DIAGNOSIS:The same  PROCEDURE: Priary Low TransverseCesarean Section  SURGEON: Dr. Belva Agee  INDICATIONS:This is a 38yo G1P0 at 26.3 wga requiring cesarean section secondary to arrest of descent.  She had PROM and was induced. She progressed well and pushed with excellent effort. After 3 hours of pushing, no further progress from +1 station and pt spiked temp to 102.2 Decision made to proceed with pLTCS.The risks of cesarean section discussed with the patient included but were not limited to: bleeding which may require transfusion or reoperation; infection which may require antibiotics; injury to bowel, bladder, ureters or other surrounding organs; injury to the fetus; need for additional procedures including hysterectomy in the event of a life-threatening hemorrhage; placental abnormalities wth subsequent pregnancies, incisional problems, thromboembolic phenomenon and other postoperative/anesthesia complications. The patient agreed with the proposed plan, giving informed consent for the procedure.   FINDINGS: Viable femaleinfant in vertex presentation,APGARs 8, 9, Weight pending, Amniotic fluid clear, Intact placenta, three vessel cord. Grossly normal uterus, ovaries and fallopian tubes. .  ANESTHESIA: Epidural ESTIMATED BLOOD LOSS: 551cc SPECIMENS: Placenta for routine COMPLICATIONS: None immediate  PROCEDURE IN DETAIL: The patient received intravenous antibiotics  and had sequential compression devices applied to her lower extremities while in the preoperative area. Shewasthen taken to the operating roomwhere epidural anesthesiawas dosed up to surgical level andwas found to be adequate. She was then placed in a dorsal supine position with a leftward tilt,and prepped and draped in a sterile manner.A foley  catheter was placed into her bladder and attached to constant gravity. After an adequate timeout was performed, aPfannenstiel skin incision was made with scalpel and carried through to the underlying layer of fascia. The fascia was incised in the midline and this incision was extended bilaterally using the Mayo scissors. Kocher clamps were applied to the superior aspect of the fascial incision and the underlying rectus muscles were dissected off bluntly. A similar process was carried out on the inferior aspect of the facial incision. The rectus muscles were separated in the midline bluntly and the peritoneum was entered bluntly. A bladder flap was created sharply and developed bluntly.Atransverse hysterotomy was made with a scalpel and extended bilaterally bluntly. The bladder blade was then removed. The infant was successfully delivered, and cord was clamped and cut and infant was handed over to awaiting neonatology team. Uterine massage was then administered and the placenta delivered intact with three-vessel cord. Cord gases were taken. The uterus was cleared of clot and debris. The hysterotomy was closed with 0 vicryl.A small ~1.5cm extension in RLUS was noted and closed in a running stitch.A second imbricating suture of 0-vicryl was used to reinforce the incision and aid in hemostasis.The fascia was closed with 0-Vicryl in a running fashion with good restoration of anatomy. The subcutaneus tissue was irrigated and was reapproximated using three interrupted plain gut stitches. The skin was closed with 4-0 Vicryl in a subcuticular fashion.  Final EBL was 551cc (all surgical site and was hemostatic at end of procedure) without any further bleeding on exam.    It's a girl - "Myra"!!  Will continue gent/clinda x 24 hours pp for chorioamnionitis.   Pt tolerated the procedure well. All sponge/lap/needle counts were correct X 2. Pt taken to recovery room in stable condition.   Belva Agee MD

## 2019-06-01 NOTE — Lactation Note (Signed)
This note was copied from a baby's chart. Lactation Consultation Note  Patient Name: Elizabeth Cannon IOEVO'J Date: 06/01/2019 Reason for consult: Initial assessment;Early term 37-38.6wks;Primapara;1st time breastfeeding  P1 mother whose infant is now 40 hours old.  This is an ETI at 38+3 weeks.  Baby was STS with mother when I arrived.  Mother was happy to report that baby has a "good suction" and fed well after delivery for 30 minutes.    Encouraged to feed 8-12 times/24 hours or sooner if baby shows feeding cues.  Reviewed cues.  Mother has been taught hand expression and has been able to see colostrum drops.  Container provided and milk storage times reviewed.  Finger feeding demonstrated.  Discussed the "ETI" and suggested mother feed baby at least every three hours due to gestational age.  Suggested she awaken, removed her blanket and change a diaper prior to latching.  She can call her RN/LC for latch assistance as needed.  Mother verbalized understanding.  Mom made aware of O/P services, breastfeeding support groups, community resources, and our phone # for post-discharge questions.  Mother is a Producer, television/film/video.  List of available pumps provided and mother will call me when she has made her decision.  Reassured her that she can call me tomorrow also if she does not have the chance to review pumps today.  Father and grandmother present.     Maternal Data Formula Feeding for Exclusion: No Has patient been taught Hand Expression?: Yes Does the patient have breastfeeding experience prior to this delivery?: No  Feeding Feeding Type: Breast Fed  LATCH Score Latch: Grasps breast easily, tongue down, lips flanged, rhythmical sucking.  Audible Swallowing: A few with stimulation  Type of Nipple: Everted at rest and after stimulation  Comfort (Breast/Nipple): Soft / non-tender  Hold (Positioning): Assistance needed to correctly position infant at breast and maintain latch.  LATCH  Score: 8  Interventions    Lactation Tools Discussed/Used WIC Program: No   Consult Status Consult Status: Follow-up Date: 06/02/19 Follow-up type: In-patient    Dora Sims 06/01/2019, 2:53 PM

## 2019-06-01 NOTE — Anesthesia Postprocedure Evaluation (Signed)
Anesthesia Post Note  Patient: RAMONA RUARK  Procedure(s) Performed: CESAREAN SECTION (N/A )     Patient location during evaluation: Mother Baby Anesthesia Type: Epidural Level of consciousness: oriented and awake and alert Pain management: pain level controlled Vital Signs Assessment: post-procedure vital signs reviewed and stable Respiratory status: spontaneous breathing and respiratory function stable Cardiovascular status: blood pressure returned to baseline and stable Postop Assessment: no headache, no backache, no apparent nausea or vomiting and able to ambulate Anesthetic complications: no    Last Vitals:  Vitals:   06/01/19 1327 06/01/19 1435  BP: 104/68 107/68  Pulse: 88 79  Resp: 18 16  Temp: 37.4 C 37.5 C  SpO2: 98% 96%    Last Pain:  Vitals:   06/01/19 1435  TempSrc: Axillary  PainSc: 0-No pain   Pain Goal:                Epidural/Spinal Function Cutaneous sensation: Able to Discern Pressure (06/01/19 1435), Patient able to flex knees: Yes (06/01/19 1435), Patient able to lift hips off bed: Yes (06/01/19 1435), Back pain beyond tenderness at insertion site: No (06/01/19 1435), Progressively worsening motor and/or sensory loss: No (06/01/19 1435), Bowel and/or bladder incontinence post epidural: No (06/01/19 1435)  Trevor Iha

## 2019-06-01 NOTE — Progress Notes (Signed)
Elizabeth Cannon is a 39 y.o. G1P0 at [redacted]w[redacted]d by ultrasound admitted for PROM  Subjective: Comfortable with CLEA  Objective: BP (!) 137/32   Pulse 96   Temp 100.1 F (37.8 C) (Axillary) Comment (Src): pt had been eating ice chips  Resp 16   Ht 5\' 3"  (1.6 m)   Wt 77.2 kg   SpO2 98%   BMI 30.15 kg/m  I/O last 3 completed shifts: In: -  Out: 600 [Urine:600] No intake/output data recorded.  FHT:  FHR: 135 bpm, variability: moderate,  accelerations:  Present,  decelerations:  Absent UC:   regular, every 2 minutes SVE:   Dilation: 10 Effacement (%): 100 Station: Plus 1 Exam by:: C Cornetto RN  Labs: Lab Results  Component Value Date   WBC 10.7 (H) 05/31/2019   HGB 12.4 05/31/2019   HCT 38.6 05/31/2019   MCV 94.6 05/31/2019   PLT 172 05/31/2019    Assessment / Plan: Augmentation of labor, progressing well  Labor: Progressing normally.  Patient has been pushing x nearly 2 hours with minimal descent.  Continue to monitor.   Preeclampsia:  n/a Fetal Wellbeing:  Category I Pain Control:  Epidural I/D:  n/a Anticipated MOD:  NSVD  06/02/2019 06/01/2019, 8:37 AM

## 2019-06-02 LAB — CBC
HCT: 28.7 % — ABNORMAL LOW (ref 36.0–46.0)
Hemoglobin: 9.4 g/dL — ABNORMAL LOW (ref 12.0–15.0)
MCH: 30.6 pg (ref 26.0–34.0)
MCHC: 32.8 g/dL (ref 30.0–36.0)
MCV: 93.5 fL (ref 80.0–100.0)
Platelets: 141 10*3/uL — ABNORMAL LOW (ref 150–400)
RBC: 3.07 MIL/uL — ABNORMAL LOW (ref 3.87–5.11)
RDW: 13.2 % (ref 11.5–15.5)
WBC: 16.1 10*3/uL — ABNORMAL HIGH (ref 4.0–10.5)
nRBC: 0 % (ref 0.0–0.2)

## 2019-06-02 NOTE — Progress Notes (Signed)
Subjective: Postpartum Day 1: Cesarean Delivery Patient reports tolerating PO, + flatus and no problems voiding.    Objective: Vital signs in last 24 hours: Temp:  [97.8 F (36.6 C)-102.2 F (39 C)] 98.4 F (36.9 C) (04/27 0815) Pulse Rate:  [76-108] 89 (04/27 0815) Resp:  [16-24] 18 (04/27 0815) BP: (65-114)/(51-76) 97/66 (04/27 0815) SpO2:  [96 %-100 %] 97 % (04/27 0815)  Physical Exam:  General: alert, cooperative, appears stated age and no distress Lochia: appropriate Uterine Fundus: firm Incision: healing well DVT Evaluation: No evidence of DVT seen on physical exam.  Recent Labs    05/31/19 1716 06/02/19 0425  HGB 12.4 9.4*  HCT 38.6 28.7*    Assessment/Plan: Status post Cesarean section. Postoperative course complicated by Chorio now 24 h abx postop. No sxs of endometritis.  Will d/c abx Continue current care.  Elizabeth Cannon 06/02/2019, 8:36 AM

## 2019-06-02 NOTE — Lactation Note (Addendum)
This note was copied from a baby's chart. Lactation Consultation Note  Patient Name: Elizabeth Cannon WCHEN'I Date: 06/02/2019 Reason for consult: Follow-up assessment;Difficult latch;Mother's request P1, 16 hour ETI female infant. Mom requested LC services due to painful latches with abrasion on right nipple with bleeding.  LC entered room mom was attempting to latch infant in laid back football hold, pillows to high and infant above breast. Infant was latched on nipple tip did not have whole areola in its mouth, LC asked mom to break latch and LC readjusted pillow support for infant. Mom re-latched infant on right breast, LC ask mom to wait until infant mouth is wide, tongue down, bring infant towards breast chin first, mom hold breast im "U hold" for breast support, infant latched and keep slipping to nipple tip repeatedly. LC assisted mom with re-latch and firmly held infant close to breast, after few minutes mom understood to support infant's back and neck with other hand not place hand on head, infant sustained latch, mom felt tug and could tell difference between shallow and deep latch. Infant was still breastfeeding after 15 minutes when LC left the room. Mom knows to break latch if she feels nipple pain and re-latch infant at breast. Mom knows to call RN or LC if she needs any further assistance with latching infant at breast. RN will give mom coconut oil to help with breast tenderness and soreness. Mom will continue to breastfeed infant according to hunger cues, on demand and not exceed 3 hours without feeding infant.  Maternal Data    Feeding Feeding Type: Breast Fed  LATCH Score Latch: Repeated attempts needed to sustain latch, nipple held in mouth throughout feeding, stimulation needed to elicit sucking reflex.(After 5 minutes infant started sustaining latch)  Audible Swallowing: Spontaneous and intermittent  Type of Nipple: Everted at rest and after stimulation  Comfort  (Breast/Nipple): Filling, red/small blisters or bruises, mild/mod discomfort  Hold (Positioning): Assistance needed to correctly position infant at breast and maintain latch.  LATCH Score: 7  Interventions Interventions: Adjust position;Support pillows;Skin to skin;Breast compression;Position options;Breast massage;Expressed milk;Hand express;Coconut oil  Lactation Tools Discussed/Used Tools: Coconut oil   Consult Status Consult Status: Follow-up Date: 06/03/19 Follow-up type: In-patient    Danelle Earthly 06/02/2019, 2:26 AM

## 2019-06-02 NOTE — Lactation Note (Signed)
This note was copied from a baby's chart. Lactation Consultation Note  Patient Name: Elizabeth Cannon [id number] Date: 06/02/2019 Reason for consult: Follow-up assessment;Early term 37-38.6wks;Primapara;1st time breastfeeding  P1 mother whose infant is now 97 hours old.  This is an ETI at 38+3 weeks.  Mother requested latch assistance.  Mother has been working on obtaining a deeper latch; states her nipples are sore.  Upon observation, mother's breasts are soft and non tender and nipples are everted and intact.  No breakdown noted, pink and well rounded.  Mother may just be getting used to some nipple sensitivity with latching.  However, we did discuss the importance of obtaining a good deep latch.  Asked mother to demonstrate hand expression.  With some guidance I was able to assist her with a better technique.  She was able to express two small drops which I finger fed back to baby.  Positioned pillows appropriately and assisted baby to latch to the right breast in the cross cradle hold easily.  She began to suck immediately.  Demonstrated breast compressions during feeding and how to gently stimulate baby to keep her awake during feedings.  Asked mother to always feed her STS in the hospital and to really help her awaken prior to latching.  Observed baby feeding for 15 minutes while reviewing breast feeding basics.  Baby was very calm and content after feeding.  Placed her STS on mother's chest and covered her with a blanket.  For nipple soreness I recommended EBM and coconut oil.  Provided coconut oil and comfort gels with instructions for use.  Mother appreciative.  Father present and a good support for mother.  Mother is a Producer, television/film/video and I provided a list of our DEBP choices yesterday.  Mother has still not had an opportunity to review the pump choices.  I reassured her that, as long as she decides by discharge day, there is no urgency to pick one until she has had a chance to review them.     Discussed cluster feeding and asked mother to call her RN/LC for further questions/concerns.     Maternal Data Formula Feeding for Exclusion: No Has patient been taught Hand Expression?: Yes Does the patient have breastfeeding experience prior to this delivery?: No  Feeding Feeding Type: Breast Fed  LATCH Score Latch: Grasps breast easily, tongue down, lips flanged, rhythmical sucking.  Audible Swallowing: A few with stimulation  Type of Nipple: Everted at rest and after stimulation  Comfort (Breast/Nipple): Soft / non-tender  Hold (Positioning): Assistance needed to correctly position infant at breast and maintain latch.  LATCH Score: 8  Interventions Interventions: Breast feeding basics reviewed;Assisted with latch;Skin to skin;Breast massage;Hand express;Breast compression;Adjust position;Comfort gels;Coconut oil;Position options;Support pillows  Lactation Tools Discussed/Used WIC Program: No   Consult Status Consult Status: Follow-up Date: 06/03/19 Follow-up type: In-patient    Elizabeth Cannon R Elizabeth Cannon 06/02/2019, 3:24 PM

## 2019-06-03 NOTE — Progress Notes (Signed)
Subjective: Postpartum Day 2: Cesarean Delivery Patient reports incisional pain, tolerating PO, + flatus and no problems voiding.    Objective: Vital signs in last 24 hours: Temp:  [97.9 F (36.6 C)-98.4 F (36.9 C)] 97.9 F (36.6 C) (04/28 0541) Pulse Rate:  [74-89] 74 (04/28 0541) Resp:  [16-18] 18 (04/28 0541) BP: (95-109)/(57-66) 109/60 (04/28 0541) SpO2:  [97 %-100 %] 100 % (04/28 0541)  Physical Exam:  General: alert, cooperative and no distress Lochia: appropriate Uterine Fundus: firm, NT Incision: healing well DVT Evaluation: No evidence of DVT seen on physical exam.  Recent Labs    05/31/19 1716 06/02/19 0425  HGB 12.4 9.4*  HCT 38.6 28.7*    Assessment/Plan: Status post Cesarean section. Doing well postoperatively.  Continue current care. D/W discharge instructions for possible afternoon discharge. [name] II 06/03/2019, 7:49 AM

## 2019-06-03 NOTE — Lactation Note (Addendum)
This note was copied from a baby's chart. Lactation Consultation Note  Patient Name: Elizabeth Cannon [id number]'Y Date: 06/03/2019 Reason for consult: Follow-up assessment;Nipple pain/trauma   P1, Baby 48 hours old.  Parents request assistance. Mother has positional stripe on R nipple and abrasion on tip of L nipple complaining of pain with latching. Mother wanted review of hand expression.  Mother happy to view drops. Assisted with latching in both laid back and football hold. Mother has pain with initial latching and complaining of biting feeling.   Baby has strong suck with cupped tongue. Applied #24 NS to see if it would alleviate pain which it did not. Provided plan to heal sore nipples and discussed/demonstrated importance of depth. For soreness suggest mother apply ebm or coconut oil while wearing shells and alternate with comfort gels. Mother felt comfort gels really helped sooth after feeding on both breasts.   Provided family with employee pump. Reviewed how to use pump.  Mother pumped x 1 last night.  Encourage pumping at least every other feeding 4-6 times per day, more if unable to tolerate feeding.  Plan: Feed baby on demand when able to tolerate 8-12 times per day. Post pump and FOB will supplement with formula/ebm. Give volume back to baby at next feeding after breastfeeding.    Maternal Data Has patient been taught Hand Expression?: Yes  Feeding Feeding Type: Breast Fed  LATCH Score Latch: Grasps breast easily, tongue down, lips flanged, rhythmical sucking.  Audible Swallowing: A few with stimulation  Type of Nipple: Everted at rest and after stimulation  Comfort (Breast/Nipple): Filling, red/small blisters or bruises, mild/mod discomfort  Hold (Positioning): Assistance needed to correctly position infant at breast and maintain latch.  LATCH Score: 7  Interventions Interventions: Breast feeding basics reviewed;Assisted with latch;Hand express;Pre-pump if  needed;Adjust position;Support pillows;Position options;DEBP;Coconut oil;Shells  Lactation Tools Discussed/Used     Consult Status Consult Status: Follow-up Date: 06/04/19 Follow-up type: In-patient    Dahlia Byes [facility] 06/03/2019, 10:51 AM

## 2019-06-03 NOTE — Lactation Note (Signed)
This note was copied from a baby's chart. Lactation Consultation Note  Patient Name: Elizabeth Cannon MNOTR'R Date: 06/03/2019 Reason for consult: Follow-up assessment;MD order  I attempted to see [name] based on MD note that she needed additional lactation follow up. Ms. Stumpp was sleeping upon entry. Her support person states that she wants to see lactation and he wanted to know when I leave. I let him know my shift ends at 1900. He stated he would try to call me around 1845.  Consult Status Consult Status: Follow-up Date: 06/04/19 Follow-up type: In-patient    Walker Shadow 06/03/2019, 6:19 PM

## 2019-06-03 NOTE — Lactation Note (Signed)
This note was copied from a baby's chart. Lactation Consultation Note  Patient Name: Elizabeth Cannon SVXBL'T Date: 06/03/2019 Reason for consult: Follow-up assessment;MD order  I followed up with Ms. Henrie upon request. She reports that she has had pain with latching. I did a quick breast exam and showed her reverse pressure softening. Compression bruising noted (stripes). Areolar edema noted that responded well with RPS.  Assisted latching baby Myra in football hold. She does not want to open her mouth wide to latch and some tongue sucking noted. After a few attempts, she latched with reasonable depth, but could be deeper. Ms. Guile reported pain.  I then tried with a 24 NS, with no improvement. Baby compresses shield vs. Dropping jaw.  I showed Ms. Mcgaugh and her support person some genlte suck exercises. I recommended that if her breasts were too sore to breast feed tonight that she pump every three hours. Pump review completed.  LC to follow up again in the am. Reviewed plan set forth by [facility] earlier today.  Feeding Feeding Type: Breast Fed  LATCH Score Latch: Grasps breast easily, tongue down, lips flanged, rhythmical sucking.  Audible Swallowing: None  Type of Nipple: Everted at rest and after stimulation  Comfort (Breast/Nipple): Engorged, cracked, bleeding, large blisters, severe discomfort  Hold (Positioning): Assistance needed to correctly position infant at breast and maintain latch.  LATCH Score: 5  Interventions    Lactation Tools Discussed/Used Pump Review: Setup, frequency, and cleaning   Consult Status Consult Status: Follow-up Date: 06/04/19 Follow-up type: In-patient    Walker Shadow 06/03/2019, 7:08 PM

## 2019-06-04 MED ORDER — IBUPROFEN 800 MG PO TABS: 800.0000 mg | ORAL_TABLET | Freq: Four times a day (QID) | ORAL | 0 refills | Status: DC

## 2019-06-04 MED ORDER — OXYCODONE HCL 5 MG PO TABS: 5.0000 mg | ORAL_TABLET | ORAL | 0 refills | Status: DC | PRN

## 2019-06-04 MED FILL — oxyCODONE HCL 5 MG TABS: 5 | 5 days supply | Qty: 30 | Fill #0

## 2019-06-04 MED FILL — IBUPROFEN 800 MG TABS: 800 | 8 days supply | Qty: 30 | Fill #0

## 2019-06-04 NOTE — Lactation Note (Signed)
This note was copied from a baby's chart. Lactation Consultation Note  Patient Name: Girl Edgar Corrigan GRMBO'B Date: 06/04/2019 Reason for consult: Initial assessment;Nipple pain/trauma;Early term 37-38.6wks Baby is 70 hours old/6% weight loss.  Mom feels nipple pain in improving.  Mom is pumping but no milk obtained yet.  Baby is receiving formula feeds as supplement.  Discussed milk coming to volume and the prevention and treatment of engorgement.  Mom has a DEBP for home use.  Answered questions.  Reviewed outpatient services and encouraged to call prn.  Maternal Data    Feeding Nipple Type: Slow - flow  LATCH Score                   Interventions    Lactation Tools Discussed/Used     Consult Status Consult Status: Complete Follow-up type: Call as needed    Huston Foley 06/04/2019, 8:34 AM

## 2019-06-04 NOTE — Progress Notes (Signed)
Subjective: Postpartum Day 3: Cesarean Delivery Patient reports tolerating PO, + flatus and no problems voiding.    Objective: Vital signs in last 24 hours: Temp:  [97.8 F (36.6 C)-98.4 F (36.9 C)] 98.1 F (36.7 C) (04/29 0558) Pulse Rate:  [71-75] 75 (04/29 0558) Resp:  [16-18] 18 (04/29 0558) BP: (111-123)/(64-87) 111/87 (04/29 0558) SpO2:  [96 %-100 %] 100 % (04/29 0558)  Physical Exam:  General: alert, cooperative and appears stated age Lochia: appropriate Uterine Fundus: firm Incision: healing well, no significant drainage, no dehiscence DVT Evaluation: No evidence of DVT seen on physical exam. Negative Homan's sign. No cords or calf tenderness.  Recent Labs    06/02/19 0425  HGB 9.4*  HCT 28.7*    Assessment/Plan: Status post Cesarean section. Doing well postoperatively.  Discharge home with standard precautions and return to clinic in 4-6 weeks.  Mitchel Honour 06/04/2019, 10:09 AM

## 2019-06-04 NOTE — Discharge Instructions (Signed)
Call MD for T>100.4, heavy vaginal bleeding, severe abdominal pain, intractable nausea and/or vomiting, or respiratory distress.  Call office to schedule postpartum visit in 6 weeks.  Pelvic rest x 6 weeks.  No driving while taking narcotics.   °

## 2019-06-04 NOTE — Discharge Summary (Signed)
Obstetric Discharge Summary Reason for Admission: rupture of membranes Prenatal Procedures: none Intrapartum Procedures: cesarean: low cervical, transverse for arrest of descent Postpartum Procedures: none Complications-Operative and Postpartum: none Hemoglobin  Date Value Ref Range Status  06/02/2019 9.4 (L) 12.0 - 15.0 g/dL Final    Comment:    REPEATED TO VERIFY   HCT  Date Value Ref Range Status  06/02/2019 28.7 (L) 36.0 - 46.0 % Final    Physical Exam:  General: alert, cooperative and appears stated age 39: appropriate Uterine Fundus: firm Incision: healing well, no significant drainage, no dehiscence DVT Evaluation: No evidence of DVT seen on physical exam. Negative Homan's sign. No cords or calf tenderness.  Discharge Diagnoses: Term Pregnancy-delivered  Discharge Information: Date: 06/04/2019 Activity: pelvic rest Diet: routine Medications: PNV, Ibuprofen and oxicodone Condition: stable Instructions: refer to practice specific booklet Discharge to: home   Newborn Data: Live born female  Birth Weight: 7 lb 6.3 oz (3355 g) APGAR: 8, 9  Newborn Delivery   Birth date/time: 06/01/2017 10:16:00 Delivery type: C-Section, Low Transverse Trial of labor: Yes C-section categorization: Primary      Home with mother.  Elizabeth Cannon 06/04/2019, 10:24 AM

## 2019-07-13 DIAGNOSIS — Z1389 Encounter for screening for other disorder: Secondary | ICD-10-CM | POA: Diagnosis not present

## 2020-08-24 ENCOUNTER — Other Ambulatory Visit (HOSPITAL_COMMUNITY): Payer: Self-pay

## 2020-08-24 MED ORDER — "BD LUER-LOK SYRINGE 18G X 1-1/2"" 3 ML MISC"
2 refills | Status: DC
  Filled 2020-08-24: qty 30, 30d supply, fill #0
  Filled 2020-10-21: qty 30, 30d supply, fill #1

## 2020-08-24 MED ORDER — PROGESTERONE 50 MG/ML IM OIL
50.0000 mg | TOPICAL_OIL | Freq: Every day | INTRAMUSCULAR | 2 refills | Status: DC
  Filled 2020-08-24: qty 30, 30d supply, fill #0
  Filled 2020-10-21: qty 30, 30d supply, fill #1

## 2020-08-24 MED ORDER — ESTRADIOL 2 MG PO TABS
2.0000 mg | ORAL_TABLET | Freq: Two times a day (BID) | ORAL | 3 refills | Status: DC
  Filled 2020-08-24: qty 60, 30d supply, fill #0
  Filled 2020-10-11: qty 60, 30d supply, fill #1

## 2020-08-24 MED ORDER — METHYLPREDNISOLONE 4 MG PO TABS
8.0000 mg | ORAL_TABLET | Freq: Two times a day (BID) | ORAL | 2 refills | Status: DC
  Filled 2020-08-24: qty 16, 4d supply, fill #0

## 2020-08-24 MED ORDER — ESTRADIOL 0.1 MG/24HR TD PTTW
1.0000 | MEDICATED_PATCH | TRANSDERMAL | 3 refills | Status: DC
  Filled 2020-08-24: qty 8, 30d supply, fill #0
  Filled 2020-10-11: qty 8, 30d supply, fill #1
  Filled 2020-11-03: qty 8, 30d supply, fill #2

## 2020-08-24 MED ORDER — "NEEDLE (DISP) 22G X 1"" MISC"
2 refills | Status: DC
  Filled 2020-08-24: qty 30, 30d supply, fill #0
  Filled 2020-10-21: qty 30, 30d supply, fill #1

## 2020-08-25 ENCOUNTER — Other Ambulatory Visit (HOSPITAL_COMMUNITY): Payer: Self-pay

## 2020-08-30 ENCOUNTER — Other Ambulatory Visit (HOSPITAL_COMMUNITY): Payer: Self-pay

## 2020-08-31 ENCOUNTER — Other Ambulatory Visit (HOSPITAL_COMMUNITY): Payer: Self-pay

## 2020-08-31 DIAGNOSIS — Z3141 Encounter for fertility testing: Secondary | ICD-10-CM | POA: Diagnosis not present

## 2020-08-31 DIAGNOSIS — N8500 Endometrial hyperplasia, unspecified: Secondary | ICD-10-CM | POA: Diagnosis not present

## 2020-08-31 MED ORDER — DOXYCYCLINE HYCLATE 100 MG PO CAPS
100.0000 mg | ORAL_CAPSULE | Freq: Two times a day (BID) | ORAL | 0 refills | Status: DC
  Filled 2020-08-31: qty 10, 5d supply, fill #0

## 2020-09-29 DIAGNOSIS — N979 Female infertility, unspecified: Secondary | ICD-10-CM | POA: Diagnosis not present

## 2020-09-29 DIAGNOSIS — Z3183 Encounter for assisted reproductive fertility procedure cycle: Secondary | ICD-10-CM | POA: Diagnosis not present

## 2020-10-06 DIAGNOSIS — Z3183 Encounter for assisted reproductive fertility procedure cycle: Secondary | ICD-10-CM | POA: Diagnosis not present

## 2020-10-06 DIAGNOSIS — N979 Female infertility, unspecified: Secondary | ICD-10-CM | POA: Diagnosis not present

## 2020-10-11 ENCOUNTER — Other Ambulatory Visit (HOSPITAL_COMMUNITY): Payer: Self-pay

## 2020-10-14 DIAGNOSIS — Z3201 Encounter for pregnancy test, result positive: Secondary | ICD-10-CM | POA: Diagnosis not present

## 2020-10-14 DIAGNOSIS — Z3200 Encounter for pregnancy test, result unknown: Secondary | ICD-10-CM | POA: Diagnosis not present

## 2020-10-16 IMAGING — RF DG HYSTEROGRAM
4 series · 8 of 8 positions shown · non-contrast
Comparison: None.

CLINICAL DATA: Encounter for fertility testing.

EXAM:
HYSTEROSALPINGOGRAM
TECHNIQUE: Hysterosalpingogram was performed by the ordering physician under
fluoroscopy. Fluoroscopic images were submitted for radiologic
interpretation following the procedure. Please see the procedural
report for the amount of contrast and the fluoroscopy time utilized.

[Series 1: run · 2 of 2 slices shown (1 of 4)]
[im 1/2]
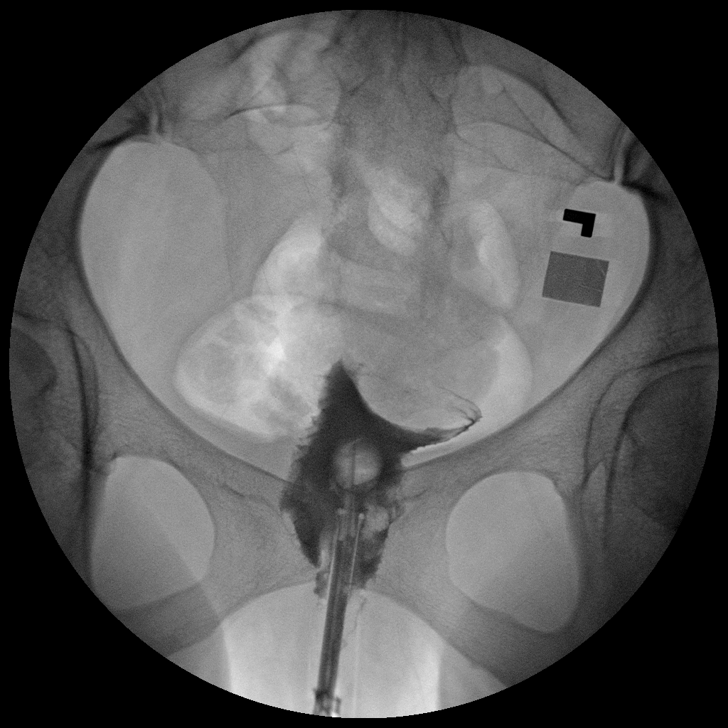
[im 2/2]
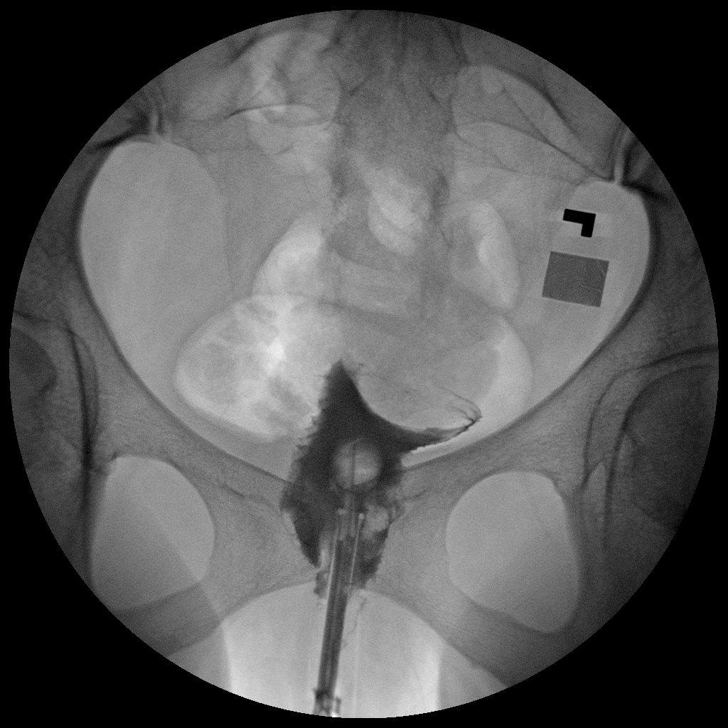

[Series 2: run · 2 of 2 slices shown (2 of 4)]
[im 1/2]
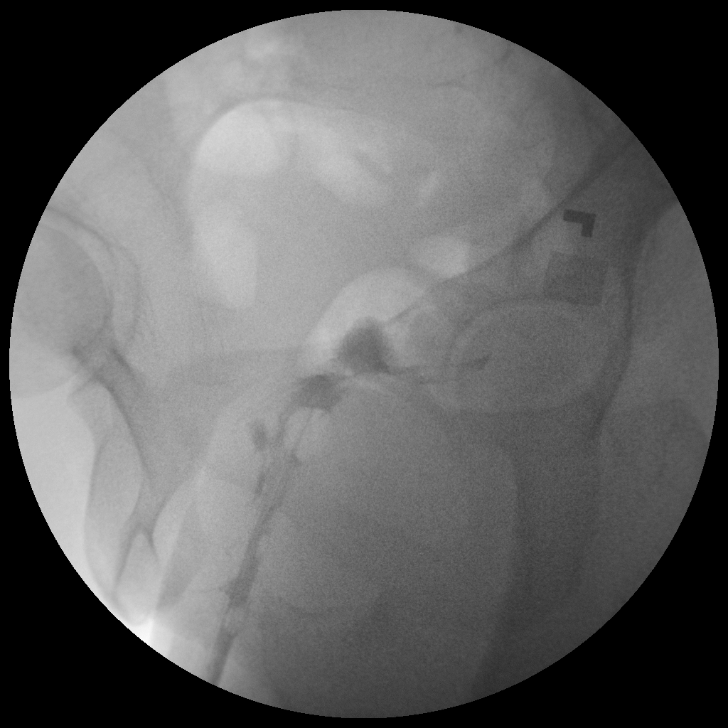
[im 2/2]
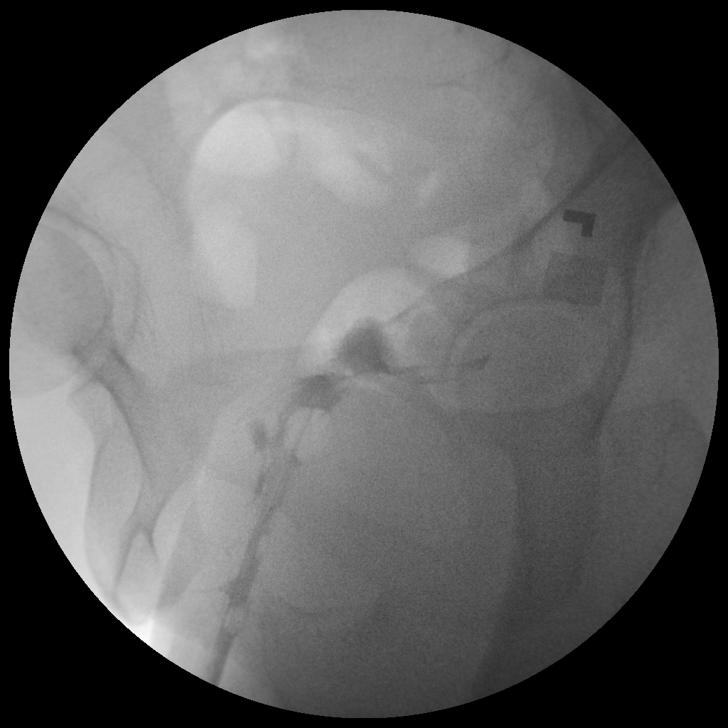

[Series 3: run · 2 of 2 slices shown (3 of 4)]
[im 1/2]
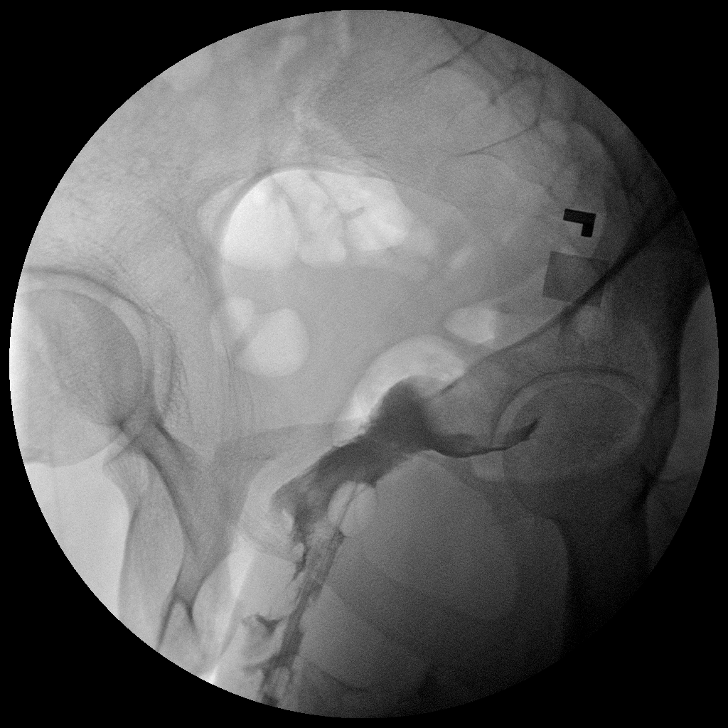
[im 2/2]
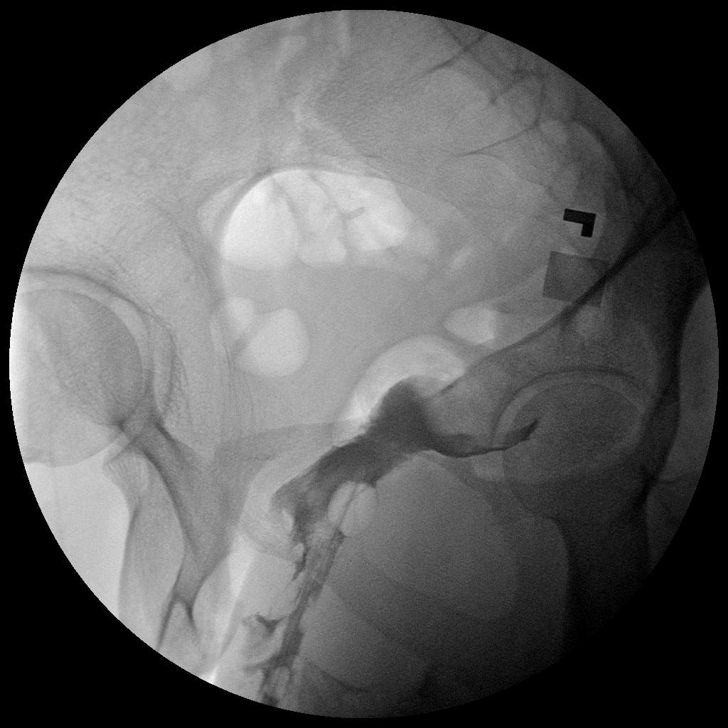

[Series 4: run · 2 of 2 slices shown (4 of 4)]
[im 1/2]
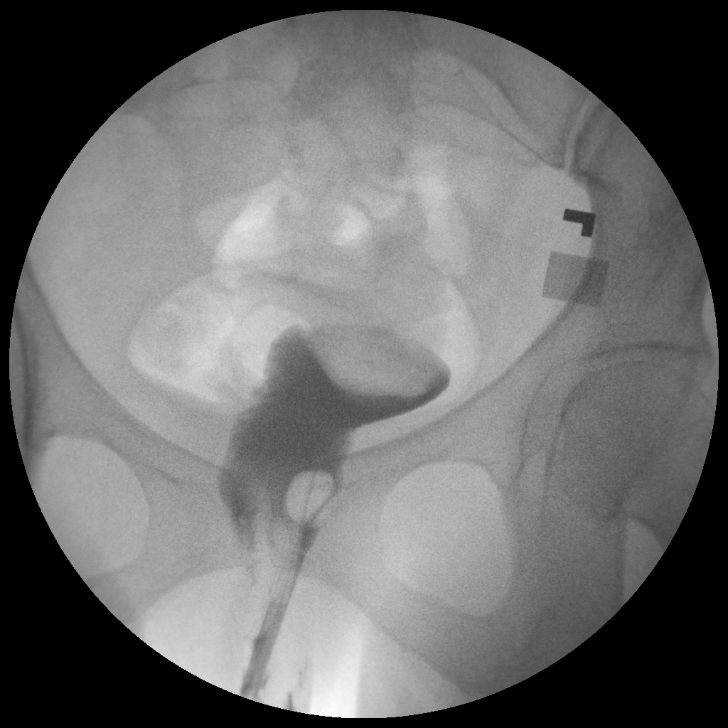
[im 2/2]
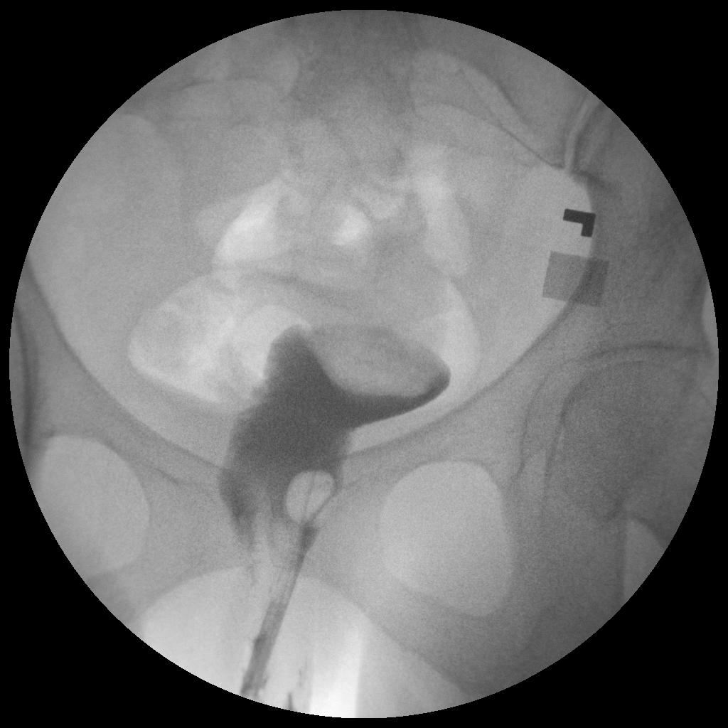

[8 of 8 positions shown; findings below may reference images not displayed]

FINDINGS: Contrast is identified within the vaginal apex and vaginal fornices.
No contrast identified within the endometrial canal.
IMPRESSION: No evidence of contrast within the uterus or Fallopian tubes.

I discussed the findings with Dr. Ig.

## 2020-10-17 DIAGNOSIS — Z3201 Encounter for pregnancy test, result positive: Secondary | ICD-10-CM | POA: Diagnosis not present

## 2020-10-17 DIAGNOSIS — Z3200 Encounter for pregnancy test, result unknown: Secondary | ICD-10-CM | POA: Diagnosis not present

## 2020-10-21 ENCOUNTER — Other Ambulatory Visit (HOSPITAL_COMMUNITY): Payer: Self-pay

## 2020-10-25 ENCOUNTER — Other Ambulatory Visit (HOSPITAL_COMMUNITY): Payer: Self-pay

## 2020-10-27 DIAGNOSIS — Z3200 Encounter for pregnancy test, result unknown: Secondary | ICD-10-CM | POA: Diagnosis not present

## 2020-11-03 ENCOUNTER — Other Ambulatory Visit (HOSPITAL_COMMUNITY): Payer: Self-pay

## 2020-11-04 ENCOUNTER — Other Ambulatory Visit (HOSPITAL_COMMUNITY): Payer: Self-pay

## 2020-11-10 DIAGNOSIS — O0900 Supervision of pregnancy with history of infertility, unspecified trimester: Secondary | ICD-10-CM | POA: Diagnosis not present

## 2020-11-23 ENCOUNTER — Other Ambulatory Visit (HOSPITAL_COMMUNITY): Payer: Self-pay

## 2020-11-23 DIAGNOSIS — Z3481 Encounter for supervision of other normal pregnancy, first trimester: Secondary | ICD-10-CM | POA: Diagnosis not present

## 2020-11-23 DIAGNOSIS — Z3685 Encounter for antenatal screening for Streptococcus B: Secondary | ICD-10-CM | POA: Diagnosis not present

## 2020-11-23 MED ORDER — PROGESTERONE 200 MG PO CAPS
200.0000 mg | ORAL_CAPSULE | Freq: Every day | ORAL | 0 refills | Status: DC
Start: 2020-11-10 — End: 2021-05-17
  Filled 2020-11-23: qty 14, 14d supply, fill #0

## 2020-12-01 DIAGNOSIS — Z01419 Encounter for gynecological examination (general) (routine) without abnormal findings: Secondary | ICD-10-CM | POA: Diagnosis not present

## 2020-12-01 DIAGNOSIS — Z3481 Encounter for supervision of other normal pregnancy, first trimester: Secondary | ICD-10-CM | POA: Diagnosis not present

## 2020-12-01 DIAGNOSIS — Z113 Encounter for screening for infections with a predominantly sexual mode of transmission: Secondary | ICD-10-CM | POA: Diagnosis not present

## 2020-12-05 DIAGNOSIS — Z3A11 11 weeks gestation of pregnancy: Secondary | ICD-10-CM | POA: Diagnosis not present

## 2020-12-05 DIAGNOSIS — O3680X9 Pregnancy with inconclusive fetal viability, other fetus: Secondary | ICD-10-CM | POA: Diagnosis not present

## 2020-12-05 DIAGNOSIS — Z3400 Encounter for supervision of normal first pregnancy, unspecified trimester: Secondary | ICD-10-CM | POA: Diagnosis not present

## 2020-12-14 DIAGNOSIS — Z3A12 12 weeks gestation of pregnancy: Secondary | ICD-10-CM | POA: Diagnosis not present

## 2020-12-14 DIAGNOSIS — Z3682 Encounter for antenatal screening for nuchal translucency: Secondary | ICD-10-CM | POA: Diagnosis not present

## 2020-12-14 DIAGNOSIS — O09521 Supervision of elderly multigravida, first trimester: Secondary | ICD-10-CM | POA: Diagnosis not present

## 2021-01-25 DIAGNOSIS — Z361 Encounter for antenatal screening for raised alphafetoprotein level: Secondary | ICD-10-CM | POA: Diagnosis not present

## 2021-01-25 DIAGNOSIS — Z363 Encounter for antenatal screening for malformations: Secondary | ICD-10-CM | POA: Diagnosis not present

## 2021-01-25 DIAGNOSIS — Z3483 Encounter for supervision of other normal pregnancy, third trimester: Secondary | ICD-10-CM | POA: Diagnosis not present

## 2021-01-25 DIAGNOSIS — Z3A18 18 weeks gestation of pregnancy: Secondary | ICD-10-CM | POA: Diagnosis not present

## 2021-03-22 DIAGNOSIS — Z3400 Encounter for supervision of normal first pregnancy, unspecified trimester: Secondary | ICD-10-CM | POA: Diagnosis not present

## 2021-03-22 DIAGNOSIS — Z3480 Encounter for supervision of other normal pregnancy, unspecified trimester: Secondary | ICD-10-CM | POA: Diagnosis not present

## 2021-03-22 DIAGNOSIS — Z3A26 26 weeks gestation of pregnancy: Secondary | ICD-10-CM | POA: Diagnosis not present

## 2021-03-22 DIAGNOSIS — Z23 Encounter for immunization: Secondary | ICD-10-CM | POA: Diagnosis not present

## 2021-03-22 DIAGNOSIS — O09819 Supervision of pregnancy resulting from assisted reproductive technology, unspecified trimester: Secondary | ICD-10-CM | POA: Diagnosis not present

## 2021-03-29 DIAGNOSIS — O99810 Abnormal glucose complicating pregnancy: Secondary | ICD-10-CM | POA: Diagnosis not present

## 2021-04-05 DIAGNOSIS — O09813 Supervision of pregnancy resulting from assisted reproductive technology, third trimester: Secondary | ICD-10-CM | POA: Diagnosis not present

## 2021-04-05 DIAGNOSIS — Z3A28 28 weeks gestation of pregnancy: Secondary | ICD-10-CM | POA: Diagnosis not present

## 2021-04-05 DIAGNOSIS — Z3400 Encounter for supervision of normal first pregnancy, unspecified trimester: Secondary | ICD-10-CM | POA: Diagnosis not present

## 2021-05-17 ENCOUNTER — Inpatient Hospital Stay (EMERGENCY_DEPARTMENT_HOSPITAL)
Admission: AD | Admit: 2021-05-17 | Discharge: 2021-05-17 | Disposition: A | Discharge status: Home / Self Care | Payer: 59 | Source: Home / Self Care | Attending: Obstetrics and Gynecology | Admitting: Obstetrics and Gynecology

## 2021-05-17 ENCOUNTER — Encounter (HOSPITAL_COMMUNITY): Payer: Self-pay | Admitting: Obstetrics and Gynecology

## 2021-05-17 ENCOUNTER — Other Ambulatory Visit: Payer: Self-pay

## 2021-05-17 DIAGNOSIS — Z3A34 34 weeks gestation of pregnancy: Secondary | ICD-10-CM | POA: Insufficient documentation

## 2021-05-17 DIAGNOSIS — Z7952 Long term (current) use of systemic steroids: Secondary | ICD-10-CM | POA: Insufficient documentation

## 2021-05-17 DIAGNOSIS — O09523 Supervision of elderly multigravida, third trimester: Secondary | ICD-10-CM | POA: Diagnosis not present

## 2021-05-17 DIAGNOSIS — O26893 Other specified pregnancy related conditions, third trimester: Secondary | ICD-10-CM | POA: Diagnosis present

## 2021-05-17 DIAGNOSIS — O34219 Maternal care for unspecified type scar from previous cesarean delivery: Secondary | ICD-10-CM | POA: Insufficient documentation

## 2021-05-17 DIAGNOSIS — Z0371 Encounter for suspected problem with amniotic cavity and membrane ruled out: Secondary | ICD-10-CM | POA: Insufficient documentation

## 2021-05-17 DIAGNOSIS — O34211 Maternal care for low transverse scar from previous cesarean delivery: Secondary | ICD-10-CM | POA: Diagnosis not present

## 2021-05-17 DIAGNOSIS — Z3A35 35 weeks gestation of pregnancy: Secondary | ICD-10-CM | POA: Diagnosis not present

## 2021-05-17 DIAGNOSIS — O4700 False labor before 37 completed weeks of gestation, unspecified trimester: Secondary | ICD-10-CM

## 2021-05-17 DIAGNOSIS — Z791 Long term (current) use of non-steroidal anti-inflammatories (NSAID): Secondary | ICD-10-CM | POA: Insufficient documentation

## 2021-05-17 LAB — POCT FERN TEST: POCT Fern Test: NEGATIVE

## 2021-05-17 LAB — AMNISURE RUPTURE OF MEMBRANE (ROM) NOT AT ARMC: Amnisure ROM: NEGATIVE

## 2021-05-17 NOTE — MAU Provider Note (Addendum)
?History  ?  ? ?CSN: [id number] ? ?Arrival date and time: 05/17/21 2024 ? ? None  ?  ? ?Chief Complaint  ?Patient presents with  ? Rupture of Membranes  ? ?HPI ?Elizabeth Cannon is a 41 y.o. G2P1001 at [redacted]w[redacted]d who presents to MAU for r/o ROM. She reports that around 1800 she noticed that she leaked a small amount of clear, watery discharge. She did not have to wear a pad, but it was enough to soak through her underwear where she had to change them. She has not felt any more gushes, however reports feeling like she is constant wet. She denies itching, odor, vaginal bleeding, urinary s/s or contractions. Reports feeling some mild pelvic pressure that she rates 3/10. Endorses active fetal movement.  ? ?Patient receives prenatal care at [facility] with an OB history significant for previous c-section and AMA. She desires a repeat c-section which is scheduled on 06/20/2021. ? ?OB History   ? ? Gravida  ?2  ? Para  ?1  ? Term  ?1  ? Preterm  ?   ? AB  ?   ? Living  ?1  ?  ? ? SAB  ?   ? IAB  ?   ? Ectopic  ?   ? Multiple  ?0  ? Live Births  ?1  ?   ?  ?  ? ? ?Past Medical History:  ?Diagnosis Date  ? Medical history non-contributory   ? ? ?Past Surgical History:  ?Procedure Laterality Date  ? CESAREAN SECTION N/A 06/01/2019  ? Procedure: CESAREAN SECTION;  Surgeon: Ranae Pila, MD;  Location: [facility] LD ORS;  Service: Obstetrics;  Laterality: N/A;  ? ? ?Family History  ?Problem Relation Age of Onset  ? Hyperlipidemia Father   ? Stroke Father   ? Hypertension Father   ? Diabetes Father   ? ? ?Social History  ? ?Tobacco Use  ? Smoking status: Never  ? Smokeless tobacco: Never  ?Substance Use Topics  ? Alcohol use: Never  ? Drug use: Never  ? ? ?Allergies: No Known Allergies ? ?Medications Prior to Admission  ?Medication Sig Dispense Refill Last Dose  ? Prenatal Vit-Fe Fumarate-FA (PRENATAL MULTIVITAMIN) TABS tablet Take 1 tablet by mouth daily at 12 noon.   05/16/2021  ? doxycycline (VIBRAMYCIN) 100 MG capsule Take  1 capsule (100 mg total) by mouth 2 (two) times daily. 10 capsule 0   ? estradiol (ESTRACE) 2 MG tablet Take 1 tablet (2 mg total) by mouth 2 (two) times daily. 60 tablet 3   ? estradiol (VIVELLE-DOT) 0.1 MG/24HR patch Apply 1 patch every 3 days 8 patch 3   ? ibuprofen (ADVIL) 800 MG tablet Take 1 tablet (800 mg total) by mouth every 6 (six) hours. 30 tablet 0   ? methylPREDNISolone (MEDROL) 4 MG tablet Take 2 tablets (8 mg total) by mouth 2 (two) times daily. 16 tablet 2   ? NEEDLE, DISP, 22 G 22G X 1" MISC use to inject progesterone 30 each 2   ? oxyCODONE (OXY IR/ROXICODONE) 5 MG immediate release tablet Take 1 tablet (5 mg total) by mouth every 4 (four) hours as needed for moderate pain. 30 tablet 0   ? progesterone (PROMETRIUM) 200 MG capsule Take 1 capsule (200 mg total) by mouth at bedtime. 14 capsule 0   ? progesterone 50 MG/ML injection Inject 1 mL (50 mg total) into the muscle daily. 30 mL 2   ? SYRINGE-NEEDLE, DISP, 3 ML (  B-D 3CC LUER-LOK SYR 18GX1-1/2) 18G X 1-1/2" 3 ML MISC use with Progesterone 30 each 2   ? ? ?Review of Systems  ?Constitutional: Negative.   ?Gastrointestinal: Negative.   ?Genitourinary:  Positive for vaginal discharge. Negative for dysuria and vaginal bleeding.  ?Physical Exam  ? ?Blood pressure 121/68, pulse 87, temperature 99.1 ?F (37.3 ?C), temperature source Oral, resp. rate 16, height 5\' 3"  (1.6 m), weight 68.7 kg, SpO2 97 %, unknown if currently breastfeeding. ? ?Physical Exam ?Vitals and nursing note reviewed. Exam conducted with a chaperone present.  ?Constitutional:   ?   General: She is not in acute distress. ?Eyes:  ?   Pupils: Pupils are equal, round, and reactive to light.  ?Cardiovascular:  ?   Rate and Rhythm: Normal rate.  ?Pulmonary:  ?   Effort: Pulmonary effort is normal.  ?Abdominal:  ?   Palpations: Abdomen is soft.  ?   Tenderness: There is no abdominal tenderness.  ?   Comments: gravid  ?Genitourinary: ?   Comments: NEFG, vaginal walls pink with rugae, small  amount of clear/white mucoid discharge, no bleeding, cervix visually closed without lesion/masses ?VE: closed/thick/posterior ?Musculoskeletal:     ?   General: Normal range of motion.  ?   Cervical back: Normal range of motion.  ?Skin: ?   General: Skin is warm and dry.  ?Neurological:  ?   General: No focal deficit present.  ?   Mental Status: She is alert and oriented to person, place, and time.  ?Psychiatric:     ?   Mood and Affect: Mood normal.     ?   Behavior: Behavior normal.     ?   Thought Content: Thought content normal.     ?   Judgment: Judgment normal.  ? ?NST ?FHR: 125 bpm, moderate variability, +15x15 accels, no decels ?Toco: Q 2-725mins ? ?MAU Course  ?Procedures ?Results for orders placed or performed during the hospital encounter of 05/17/21 (from the past 24 hour(s))  ?Amnisure rupture of membrane (rom)not at [facility]     Status: None  ? Collection Time: 05/17/21  9:53 PM  ?Result Value Ref Range  ? Amnisure ROM NEGATIVE   ?Fern Test     Status: None  ? Collection Time: 05/17/21 10:06 PM  ?Result Value Ref Range  ? POCT Fern Test Negative = intact amniotic membranes   ?  ?MDM ?UA pending. I reviewed with both patient and her husband that based on my exam I have a low suspicion for rupture of membranes given no pooling and negative fern slide, however given a similar situation with her previous pregnancy, an amnisure would not be unreasonable. Patient and husband opt to have amnisure. Cervix is closed/thick/posterior. NST reassuring and reactive for gestational age. Toco with contractions Q 2-715mins, however patient does not feel contractions. Will let patient PO hydrate.  ? ?Amnisure negative. Patient feeling contractions now that she knows that she is having them. Will recheck patient 1 hour from 1st exam. ? ?Care handed over to C. Druscilla BrownieNeill, CNM at 2200 ? ?Brand Malesanielle L Simpson, CNM ?05/17/21 ?10:26 PM ? ?Cervix unchanged after 1.5 hours. Now feeling contractions with more regularity. Options for management  of preterm contractions reviewed including IV fluids, procardia series and terbutaline. Risks and benefits of each reviewed at length. Patient desires to PO hydrate and be discharged home. Warning signs of when to return reviewed at length. Patient and support person verbalized understanding.  ? ? ?Assessment and Plan  ? ?  1. Encounter for suspected premature rupture of amniotic membranes, with rupture of membranes not found   ?2. [redacted] weeks gestation of pregnancy   ?3. Preterm contractions   ? ?-Discharge home in stable condition ?-Preterm labor precautions discussed ?-Patient advised to follow-up with OB as scheduled for prenatal care ?-Patient may return to MAU as needed or if her condition were to change or worsen ? ?Rolm Bookbinder, CNM ?05/17/21 ?11:06 PM ? ? ?

## 2021-05-17 NOTE — Discharge Instructions (Signed)

## 2021-05-17 NOTE — MAU Note (Signed)
.  Elizabeth Cannon is a 41 y.o. at [redacted]w[redacted]d here in MAU reporting: Clear, watery LOF at 1800 while getting a pedicure this evening. Denies use of pad, but did have to change her underwear when she got home. Reports that she has not felt any ctx but reports pelvic pressure 3/10 that has been on-going. Denies VB and endorses +FM. Hx of subchorionic hemorrhage during this pregnancy. Denies any complications during her pregnancy.  ? ?Onset of complaint: 1800 ?Pain score: 3/10 ?Vitals:  ? 05/17/21 2036  ?BP: 121/68  ?Pulse: 87  ?Resp: 16  ?Temp: 99.1 ?F (37.3 ?C)  ?SpO2: 97%  ?   ?FHT:143 bpm ?Lab orders placed from triage:  UA ? ?

## 2021-05-18 LAB — URINALYSIS, ROUTINE W REFLEX MICROSCOPIC
Bilirubin Urine: NEGATIVE
Glucose, UA: NEGATIVE mg/dL
Hgb urine dipstick: NEGATIVE
Ketones, ur: NEGATIVE mg/dL
Nitrite: NEGATIVE
Protein, ur: NEGATIVE mg/dL
Specific Gravity, Urine: 1.02 (ref 1.005–1.030)
pH: 8 (ref 5.0–8.0)

## 2021-05-20 ENCOUNTER — Inpatient Hospital Stay (HOSPITAL_COMMUNITY)
Admission: AD | Admit: 2021-05-20 | Discharge: 2021-05-24 | DRG: 786 | Disposition: A | Discharge status: Home / Self Care | Payer: 59 | Attending: Obstetrics and Gynecology | Admitting: Obstetrics and Gynecology

## 2021-05-20 ENCOUNTER — Other Ambulatory Visit: Payer: Self-pay

## 2021-05-20 ENCOUNTER — Inpatient Hospital Stay (HOSPITAL_COMMUNITY): Payer: 59 | Admitting: Anesthesiology

## 2021-05-20 ENCOUNTER — Encounter (HOSPITAL_COMMUNITY): Payer: Self-pay | Admitting: Obstetrics and Gynecology

## 2021-05-20 ENCOUNTER — Encounter (HOSPITAL_COMMUNITY)
Admission: AD | Disposition: A | Discharge status: Home / Self Care | Payer: Self-pay | Source: Home / Self Care | Attending: Obstetrics and Gynecology

## 2021-05-20 DIAGNOSIS — O34211 Maternal care for low transverse scar from previous cesarean delivery: Principal | ICD-10-CM | POA: Diagnosis present

## 2021-05-20 DIAGNOSIS — O26893 Other specified pregnancy related conditions, third trimester: Secondary | ICD-10-CM | POA: Diagnosis present

## 2021-05-20 DIAGNOSIS — Z3A35 35 weeks gestation of pregnancy: Secondary | ICD-10-CM

## 2021-05-20 DIAGNOSIS — O34219 Maternal care for unspecified type scar from previous cesarean delivery: Secondary | ICD-10-CM | POA: Diagnosis not present

## 2021-05-20 LAB — URINALYSIS, ROUTINE W REFLEX MICROSCOPIC
Bilirubin Urine: NEGATIVE
Glucose, UA: NEGATIVE mg/dL
Ketones, ur: NEGATIVE mg/dL
Leukocytes,Ua: NEGATIVE
Nitrite: NEGATIVE
Protein, ur: NEGATIVE mg/dL
RBC / HPF: >50 RBC/hpf — ABNORMAL HIGH (ref 0–5)
Specific Gravity, Urine: 1.014 (ref 1.005–1.030)
pH: 7 (ref 5.0–8.0)

## 2021-05-20 LAB — CBC
HCT: 34.5 % — ABNORMAL LOW (ref 36.0–46.0)
Hemoglobin: 11.6 g/dL — ABNORMAL LOW (ref 12.0–15.0)
MCH: 30.6 pg (ref 26.0–34.0)
MCHC: 33.6 g/dL (ref 30.0–36.0)
MCV: 91 fL (ref 80.0–100.0)
Platelets: 139 10*3/uL — ABNORMAL LOW (ref 150–400)
RBC: 3.79 MIL/uL — ABNORMAL LOW (ref 3.87–5.11)
RDW: 13.3 % (ref 11.5–15.5)
WBC: 11 10*3/uL — ABNORMAL HIGH (ref 4.0–10.5)
nRBC: 0 % (ref 0.0–0.2)

## 2021-05-20 LAB — TYPE AND SCREEN
ABO/RH(D): O POS
Antibody Screen: NEGATIVE

## 2021-05-20 LAB — POCT FERN TEST: POCT Fern Test: NEGATIVE

## 2021-05-20 SURGERY — Surgical Case
Anesthesia: Spinal | Wound class: Clean Contaminated

## 2021-05-20 MED ORDER — TERBUTALINE SULFATE 1 MG/ML IJ SOLN
0.2500 mg | Freq: Once | INTRAMUSCULAR | Status: AC
  Administered 2021-05-20: 0.25 mg via SUBCUTANEOUS
  Filled 2021-05-20: qty 1

## 2021-05-20 MED ORDER — NALOXONE HCL 0.4 MG/ML IJ SOLN: 0.4000 mg | INTRAMUSCULAR | Status: DC | PRN

## 2021-05-20 MED ORDER — CEFAZOLIN SODIUM-DEXTROSE 2-4 GM/100ML-% IV SOLN
2.0000 g | INTRAVENOUS | Status: AC
  Administered 2021-05-20: 2 g via INTRAVENOUS
  Filled 2021-05-20 (×2): qty 100

## 2021-05-20 MED ORDER — SODIUM CHLORIDE 0.9% FLUSH: 3.0000 mL | INTRAVENOUS | Status: DC | PRN

## 2021-05-20 MED ORDER — OXYTOCIN-SODIUM CHLORIDE 30-0.9 UT/500ML-% IV SOLN: 2.5000 [IU]/h | INTRAVENOUS | Status: AC

## 2021-05-20 MED ORDER — PHENYLEPHRINE HCL-NACL 20-0.9 MG/250ML-% IV SOLN
INTRAVENOUS | Status: AC
  Filled 2021-05-20: qty 250

## 2021-05-20 MED ORDER — IBUPROFEN 600 MG PO TABS
600.0000 mg | ORAL_TABLET | Freq: Four times a day (QID) | ORAL | Status: DC | PRN
  Administered 2021-05-21 – 2021-05-24 (×8): 600 mg via ORAL
  Filled 2021-05-20 (×9): qty 1

## 2021-05-20 MED ORDER — NIFEDIPINE 10 MG PO CAPS
10.0000 mg | ORAL_CAPSULE | ORAL | Status: DC | PRN
  Administered 2021-05-20: 10 mg via ORAL
  Filled 2021-05-20 (×3): qty 1

## 2021-05-20 MED ORDER — BUPIVACAINE IN DEXTROSE 0.75-8.25 % IT SOLN
INTRATHECAL | Status: DC | PRN
  Administered 2021-05-20: 1.5 mL via INTRATHECAL

## 2021-05-20 MED ORDER — KETOROLAC TROMETHAMINE 30 MG/ML IJ SOLN: 30.0000 mg | Freq: Once | INTRAMUSCULAR | Status: DC | PRN

## 2021-05-20 MED ORDER — OXYTOCIN-SODIUM CHLORIDE 30-0.9 UT/500ML-% IV SOLN
INTRAVENOUS | Status: AC
  Filled 2021-05-20: qty 500

## 2021-05-20 MED ORDER — FENTANYL CITRATE (PF) 100 MCG/2ML IJ SOLN
INTRAMUSCULAR | Status: DC | PRN
Start: 2021-05-20 — End: 2021-05-20
  Administered 2021-05-20: 15 ug via INTRATHECAL

## 2021-05-20 MED ORDER — DIPHENHYDRAMINE HCL 25 MG PO CAPS: 25.0000 mg | ORAL_CAPSULE | Freq: Four times a day (QID) | ORAL | Status: DC | PRN

## 2021-05-20 MED ORDER — DIBUCAINE (PERIANAL) 1 % EX OINT: 1.0000 "application " | TOPICAL_OINTMENT | CUTANEOUS | Status: DC | PRN

## 2021-05-20 MED ORDER — NALOXONE HCL 4 MG/10ML IJ SOLN
1.0000 ug/kg/h | INTRAVENOUS | Status: DC | PRN
  Filled 2021-05-20: qty 5

## 2021-05-20 MED ORDER — MENTHOL 3 MG MT LOZG: 1.0000 | LOZENGE | OROMUCOSAL | Status: DC | PRN

## 2021-05-20 MED ORDER — ONDANSETRON HCL 4 MG/2ML IJ SOLN
INTRAMUSCULAR | Status: DC | PRN
  Administered 2021-05-20: 4 mg via INTRAVENOUS

## 2021-05-20 MED ORDER — HYDROMORPHONE HCL 1 MG/ML IJ SOLN: 0.2000 mg | INTRAMUSCULAR | Status: DC | PRN

## 2021-05-20 MED ORDER — MEPERIDINE HCL 25 MG/ML IJ SOLN: 6.2500 mg | INTRAMUSCULAR | Status: DC | PRN

## 2021-05-20 MED ORDER — ONDANSETRON HCL 4 MG/2ML IJ SOLN
4.0000 mg | Freq: Three times a day (TID) | INTRAMUSCULAR | Status: DC | PRN
  Administered 2021-05-20: 4 mg via INTRAVENOUS
  Filled 2021-05-20: qty 2

## 2021-05-20 MED ORDER — MORPHINE SULFATE (PF) 0.5 MG/ML IJ SOLN
INTRAMUSCULAR | Status: DC | PRN
  Administered 2021-05-20: 150 ug via INTRATHECAL

## 2021-05-20 MED ORDER — EPHEDRINE 5 MG/ML INJ
INTRAVENOUS | Status: AC
  Filled 2021-05-20: qty 5

## 2021-05-20 MED ORDER — SCOPOLAMINE 1 MG/3DAYS TD PT72: 1.0000 | MEDICATED_PATCH | Freq: Once | TRANSDERMAL | Status: DC

## 2021-05-20 MED ORDER — OXYTOCIN-SODIUM CHLORIDE 30-0.9 UT/500ML-% IV SOLN
INTRAVENOUS | Status: DC | PRN
  Administered 2021-05-20: 250 mL via INTRAVENOUS

## 2021-05-20 MED ORDER — FAMOTIDINE IN NACL 20-0.9 MG/50ML-% IV SOLN
20.0000 mg | Freq: Once | INTRAVENOUS | Status: AC
  Administered 2021-05-20: 20 mg via INTRAVENOUS
  Filled 2021-05-20: qty 50

## 2021-05-20 MED ORDER — SOD CITRATE-CITRIC ACID 500-334 MG/5ML PO SOLN: 30.0000 mL | ORAL | Status: AC

## 2021-05-20 MED ORDER — ZOLPIDEM TARTRATE 5 MG PO TABS: 5.0000 mg | ORAL_TABLET | Freq: Every evening | ORAL | Status: DC | PRN

## 2021-05-20 MED ORDER — SIMETHICONE 80 MG PO CHEW
80.0000 mg | CHEWABLE_TABLET | Freq: Three times a day (TID) | ORAL | Status: DC
  Administered 2021-05-21 – 2021-05-23 (×7): 80 mg via ORAL
  Filled 2021-05-20 (×8): qty 1

## 2021-05-20 MED ORDER — WITCH HAZEL-GLYCERIN EX PADS: 1.0000 "application " | MEDICATED_PAD | CUTANEOUS | Status: DC | PRN

## 2021-05-20 MED ORDER — OXYCODONE HCL 5 MG PO TABS: 5.0000 mg | ORAL_TABLET | ORAL | Status: DC | PRN

## 2021-05-20 MED ORDER — SODIUM CHLORIDE 0.9 % IR SOLN
Status: DC | PRN
Start: 2021-05-20 — End: 2021-05-20
  Administered 2021-05-20: 1

## 2021-05-20 MED ORDER — PHENYLEPHRINE HCL-NACL 20-0.9 MG/250ML-% IV SOLN
INTRAVENOUS | Status: DC | PRN
  Administered 2021-05-20: 30 ug/min via INTRAVENOUS

## 2021-05-20 MED ORDER — PRENATAL MULTIVITAMIN CH
1.0000 | ORAL_TABLET | Freq: Every day | ORAL | Status: DC
  Administered 2021-05-21 – 2021-05-24 (×4): 1 via ORAL
  Filled 2021-05-20 (×4): qty 1

## 2021-05-20 MED ORDER — PHENYLEPHRINE 40 MCG/ML (10ML) SYRINGE FOR IV PUSH (FOR BLOOD PRESSURE SUPPORT)
PREFILLED_SYRINGE | INTRAVENOUS | Status: DC | PRN
  Administered 2021-05-20: 40 ug via INTRAVENOUS
  Administered 2021-05-20: 80 ug via INTRAVENOUS
  Administered 2021-05-20 (×2): 40 ug via INTRAVENOUS

## 2021-05-20 MED ORDER — DIPHENHYDRAMINE HCL 25 MG PO CAPS: 25.0000 mg | ORAL_CAPSULE | ORAL | Status: DC | PRN

## 2021-05-20 MED ORDER — BETAMETHASONE SOD PHOS & ACET 6 (3-3) MG/ML IJ SUSP
12.0000 mg | Freq: Once | INTRAMUSCULAR | Status: AC
  Administered 2021-05-20: 12 mg via INTRAMUSCULAR
  Filled 2021-05-20: qty 5

## 2021-05-20 MED ORDER — LACTATED RINGERS IV SOLN: INTRAVENOUS | Status: DC

## 2021-05-20 MED ORDER — STERILE WATER FOR IRRIGATION IR SOLN
Status: DC | PRN
Start: 2021-05-20 — End: 2021-05-20
  Administered 2021-05-20: 1

## 2021-05-20 MED ORDER — LACTATED RINGERS IV BOLUS
1000.0000 mL | Freq: Once | INTRAVENOUS | Status: AC
  Administered 2021-05-20: 1000 mL via INTRAVENOUS

## 2021-05-20 MED ORDER — ACETAMINOPHEN 500 MG PO TABS
1000.0000 mg | ORAL_TABLET | Freq: Four times a day (QID) | ORAL | Status: DC
  Administered 2021-05-21 – 2021-05-24 (×12): 1000 mg via ORAL
  Filled 2021-05-20 (×13): qty 2

## 2021-05-20 MED ORDER — SIMETHICONE 80 MG PO CHEW: 80.0000 mg | CHEWABLE_TABLET | ORAL | Status: DC | PRN

## 2021-05-20 MED ORDER — PHENYLEPHRINE 40 MCG/ML (10ML) SYRINGE FOR IV PUSH (FOR BLOOD PRESSURE SUPPORT)
PREFILLED_SYRINGE | INTRAVENOUS | Status: AC
  Filled 2021-05-20: qty 10

## 2021-05-20 MED ORDER — TETANUS-DIPHTH-ACELL PERTUSSIS 5-2.5-18.5 LF-MCG/0.5 IM SUSY: 0.5000 mL | PREFILLED_SYRINGE | Freq: Once | INTRAMUSCULAR | Status: DC

## 2021-05-20 MED ORDER — SENNOSIDES-DOCUSATE SODIUM 8.6-50 MG PO TABS
2.0000 | ORAL_TABLET | Freq: Every day | ORAL | Status: DC
  Administered 2021-05-21 – 2021-05-23 (×3): 2 via ORAL
  Filled 2021-05-20 (×4): qty 2

## 2021-05-20 MED ORDER — MORPHINE SULFATE (PF) 0.5 MG/ML IJ SOLN
INTRAMUSCULAR | Status: AC
  Filled 2021-05-20: qty 10

## 2021-05-20 MED ORDER — POVIDONE-IODINE 10 % EX SWAB
2.0000 | Freq: Once | CUTANEOUS | Status: DC
Start: 2021-05-20 — End: 2021-05-24

## 2021-05-20 MED ORDER — EPHEDRINE SULFATE-NACL 50-0.9 MG/10ML-% IV SOSY
PREFILLED_SYRINGE | INTRAVENOUS | Status: DC | PRN
  Administered 2021-05-20 (×2): 5 mg via INTRAVENOUS

## 2021-05-20 MED ORDER — COCONUT OIL OIL
1.0000 "application " | TOPICAL_OIL | Status: DC | PRN
  Administered 2021-05-21: 1 via TOPICAL

## 2021-05-20 MED ORDER — DIPHENHYDRAMINE HCL 50 MG/ML IJ SOLN: 12.5000 mg | INTRAMUSCULAR | Status: DC | PRN

## 2021-05-20 MED ORDER — FENTANYL CITRATE (PF) 100 MCG/2ML IJ SOLN
INTRAMUSCULAR | Status: AC
Start: 2021-05-20 — End: ?
  Filled 2021-05-20: qty 2

## 2021-05-20 SURGICAL SUPPLY — 33 items
BENZOIN TINCTURE PRP APPL 2/3 (GAUZE/BANDAGES/DRESSINGS) ×1 IMPLANT
CHLORAPREP W/TINT 26ML (MISCELLANEOUS) ×4 IMPLANT
CLAMP CORD UMBIL (MISCELLANEOUS) ×2 IMPLANT
CLOSURE STERI STRIP 1/2 X4 (GAUZE/BANDAGES/DRESSINGS) ×1 IMPLANT
CLOTH BEACON ORANGE TIMEOUT ST (SAFETY) ×2 IMPLANT
DERMABOND ADVANCED (GAUZE/BANDAGES/DRESSINGS)
DERMABOND ADVANCED .7 DNX12 (GAUZE/BANDAGES/DRESSINGS) IMPLANT
DRSG OPSITE POSTOP 4X10 (GAUZE/BANDAGES/DRESSINGS) ×2 IMPLANT
ELECT REM PT RETURN 9FT ADLT (ELECTROSURGICAL) ×2
ELECTRODE REM PT RTRN 9FT ADLT (ELECTROSURGICAL) ×1 IMPLANT
EXTRACTOR VACUUM M CUP 4 TUBE (SUCTIONS) IMPLANT
GLOVE BIO SURGEON STRL SZ7.5 (GLOVE) ×2 IMPLANT
GLOVE BIOGEL PI IND STRL 7.0 (GLOVE) ×1 IMPLANT
GLOVE BIOGEL PI INDICATOR 7.0 (GLOVE) ×1
GOWN STRL REUS W/TWL LRG LVL3 (GOWN DISPOSABLE) ×4 IMPLANT
KIT ABG SYR 3ML LUER SLIP (SYRINGE) ×2 IMPLANT
NDL HYPO 25X5/8 SAFETYGLIDE (NEEDLE) ×1 IMPLANT
NEEDLE HYPO 25X5/8 SAFETYGLIDE (NEEDLE) ×2 IMPLANT
NS IRRIG 1000ML POUR BTL (IV SOLUTION) ×2 IMPLANT
PACK C SECTION WH (CUSTOM PROCEDURE TRAY) ×2 IMPLANT
PAD OB MATERNITY 4.3X12.25 (PERSONAL CARE ITEMS) ×2 IMPLANT
STRIP CLOSURE SKIN 1/2X4 (GAUZE/BANDAGES/DRESSINGS) IMPLANT
SUT MNCRL 0 VIOLET CTX 36 (SUTURE) ×4 IMPLANT
SUT MONOCRYL 0 CTX 36 (SUTURE) ×4
SUT PDS AB 0 CTX 60 (SUTURE) ×2 IMPLANT
SUT PLAIN 0 NONE (SUTURE) IMPLANT
SUT PLAIN 2 0 (SUTURE)
SUT PLAIN 2 0 XLH (SUTURE) IMPLANT
SUT PLAIN ABS 2-0 CT1 27XMFL (SUTURE) IMPLANT
SUT VIC AB 4-0 KS 27 (SUTURE) ×2 IMPLANT
TOWEL OR 17X24 6PK STRL BLUE (TOWEL DISPOSABLE) ×2 IMPLANT
TRAY FOLEY W/BAG SLVR 14FR LF (SET/KITS/TRAYS/PACK) ×2 IMPLANT
WATER STERILE IRR 1000ML POUR (IV SOLUTION) ×2 IMPLANT

## 2021-05-20 NOTE — Brief Op Note (Signed)
05/20/2021 ? ?5:15 PM ? ?PATIENT:  Elizabeth Cannon  41 y.o. female ? ?PRE-OPERATIVE DIAGNOSIS:  Previous C/S- Labor ? ?POST-OPERATIVE DIAGNOSIS:  Previous C/S- Labor ? ?PROCEDURE:  Procedure(s): ?CESAREAN SECTION (N/A) ? ?SURGEON:  Surgeon(s) and Role: ?   Harold Hedge, MD - Primary ? ?PHYSICIAN ASSISTANT:  ? ?ASSISTANTS: Evalina Field MD  ? ?ANESTHESIA:   none ? ?EBL:  133 mL  ? ?BLOOD ADMINISTERED:none ? ?DRAINS: Urinary Catheter (Foley)  ? ?LOCAL MEDICATIONS USED:  NONE ? ?SPECIMEN:  Source of Specimen:  placenta ? ?DISPOSITION OF SPECIMEN:  PATHOLOGY ? ?COUNTS:  YES ? ?TOURNIQUET:  * No tourniquets in log * ? ?DICTATION: .Other Dictation: Dictation Number [id number] ? ?PLAN OF CARE: Admit to inpatient  ? ?PATIENT DISPOSITION:  PACU - hemodynamically stable. ?  ?Delay start of Pharmacological VTE agent (>24hrs) due to surgical blood loss or risk of bleeding: not applicable ? ?

## 2021-05-20 NOTE — Anesthesia Procedure Notes (Signed)
Spinal ? ?Patient location during procedure: OR ?Start time: 05/20/2021 4:25 PM ?End time: 05/20/2021 4:27 PM ?Staffing ?Performed: anesthesiologist  ?Anesthesiologist: Atilano Median, DO ?Preanesthetic Checklist ?Completed: patient identified, IV checked, site marked, risks and benefits discussed, surgical consent, monitors and equipment checked, pre-op evaluation and timeout performed ?Spinal Block ?Patient position: sitting ?Prep: DuraPrep ?Patient monitoring: heart rate, cardiac monitor, continuous pulse ox and blood pressure ?Approach: midline ?Location: L4-5 ?Injection technique: single-shot ?Needle ?Needle type: Pencan  ?Needle gauge: 24 G ?Needle length: 10 cm ?Assessment ?Events: CSF return ?Additional Notes ?Patient identified. Risks/Benefits/Options discussed with patient including but not limited to bleeding, infection, nerve damage, paralysis, failed block, incomplete pain control, headache, blood pressure changes, nausea, vomiting, reactions to medications, itching and postpartum back pain. Confirmed with bedside nurse the patient's most recent platelet count. Confirmed with patient that they are not currently taking any anticoagulation, have any bleeding history or any family history of bleeding disorders. Patient expressed understanding and wished to proceed. All questions were answered. Sterile technique was used throughout the entire procedure. Please see nursing notes for vital signs. Warning signs of high block given to the patient including shortness of breath, tingling/numbness in hands, complete motor block, or any concerning symptoms with instructions to call for help. Patient was given instructions on fall risk and not to get out of bed. All questions and concerns addressed with instructions to call with any issues or inadequate analgesia.   ?  ? ? ? ?

## 2021-05-20 NOTE — MAU Note (Signed)
Pt reports to mau with c/o ongoing ctx that started Thursday.  Pt states ctx have gotten regular over the past hour, coming every 4-5 min.  Reports some watery, bloody, dc this morning.  FHR 140 ?

## 2021-05-20 NOTE — Anesthesia Postprocedure Evaluation (Signed)
Anesthesia Post Note ? ?Patient: ALAINAH LIGHTLE ? ?Procedure(s) Performed: CESAREAN SECTION ? ?  ? ?Patient location during evaluation: PACU ?Anesthesia Type: Spinal ?Level of consciousness: awake and alert ?Pain management: pain level controlled ?Vital Signs Assessment: post-procedure vital signs reviewed and stable ?Respiratory status: spontaneous breathing, nonlabored ventilation and respiratory function stable ?Cardiovascular status: stable and blood pressure returned to baseline ?Postop Assessment: no apparent nausea or vomiting ?Anesthetic complications: no ? ? ?No notable events documented. ? ?Last Vitals:  ?Vitals:  ? 05/20/21 1845 05/20/21 1933  ?BP: (!) 94/57 (!) 89/54  ?Pulse: (!) 57 (!) 57  ?Resp: 14 16  ?Temp:  36.6 ?C  ?SpO2:  100%  ?  ?Last Pain:  ?Vitals:  ? 05/20/21 1933  ?TempSrc:   ?PainSc: 0-No pain  ? ? ?  ?  ?  ?  ?  ?  ? ?March Rummage Apollonia Amini ? ? ? ? ?

## 2021-05-20 NOTE — Transfer of Care (Signed)
Immediate Anesthesia Transfer of Care Note ? ?Patient: Elizabeth Cannon ? ?Procedure(s) Performed: CESAREAN SECTION ? ?Patient Location: PACU ? ?Anesthesia Type:Spinal ? ?Level of Consciousness: awake, alert , oriented and patient cooperative ? ?Airway & Oxygen Therapy: Patient Spontanous Breathing ? ?Post-op Assessment: Report given to RN and Post -op Vital signs reviewed and stable ? ?Post vital signs: Reviewed and stable ? ?Last Vitals:  ?Vitals Value Taken Time  ?BP    ?Temp    ?Pulse    ?Resp    ?SpO2    ? ? ?Last Pain:  ?Vitals:  ? 05/20/21 1306  ?TempSrc: Oral  ?PainSc:   ?   ? ?Patients Stated Pain Goal: 0 (05/20/21 1113) ? ?Complications: No notable events documented. ?

## 2021-05-20 NOTE — H&P (Signed)
Elizabeth Cannon is a 41 y.o. female presenting for contractions and bright red bleeding. Evaluation in MAU noted regular firm contractions. Cervix was closed/thick by same examiner a few days ago. Today same examiner noted 1/th and UCs. IV fluids, nifedipine and SQ terbutaline given. UCs have spaced out some but continue to be painful and Cx 1.5/80/-1 and bloody show. Pregnancy complicated by previous cesarean section desires repeat, IV pregnancy with FET of PGT-A tested embryo, AMA - Panorama>46 XY. ?OB History   ? ? Gravida  ?2  ? Para  ?1  ? Term  ?1  ? Preterm  ?   ? AB  ?   ? Living  ?1  ?  ? ? SAB  ?   ? IAB  ?   ? Ectopic  ?   ? Multiple  ?0  ? Live Births  ?1  ?   ?  ?  ? ?Past Medical History:  ?Diagnosis Date  ? Medical history non-contributory   ? ?Past Surgical History:  ?Procedure Laterality Date  ? CESAREAN SECTION N/A 06/01/2019  ? Procedure: CESAREAN SECTION;  Surgeon: Ranae Pila, MD;  Location: [facility] LD ORS;  Service: Obstetrics;  Laterality: N/A;  ? ?Family History: family history includes Diabetes in her father; Hyperlipidemia in her father; Hypertension in her father; Stroke in her father. ?Social History:  reports that she has never smoked. She has never used smokeless tobacco. She reports that she does not drink alcohol and does not use drugs. ? ? ?  ?Maternal Diabetes: No ?Genetic Screening: Normal ?Maternal Ultrasounds/Referrals: Normal ?Fetal Ultrasounds or other Referrals:  None ?Maternal Substance Abuse:  No ?Significant Maternal Medications:  None ?Significant Maternal Lab Results:  None ?Other Comments:  None ? ?Review of Systems  ?Constitutional:  Negative for fever.  ?Eyes:  Negative for photophobia.  ?Neurological:  Negative for headaches.  ?Maternal Medical History:  ?Reason for admission: Contractions.  ? ?Contractions: Onset was 6-12 hours ago.   ?Fetal activity: Perceived fetal activity is normal.   ? ?Dilation: 1.5 ?Effacement (%): 80 ?Station: -3 ?Exam by:: Rayfield Citizen  Neill,CNM ?Blood pressure (!) 92/55, pulse (!) 108, temperature 98.3 ?F (36.8 ?C), temperature source Oral, resp. rate 15, SpO2 100 %, unknown if currently breastfeeding. ?Maternal Exam:  ?Uterine Assessment: Contraction strength is firm.  Contraction frequency is irregular.  ?Abdomen: Patient reports no abdominal tenderness.  ? ?Fetal Exam ?Fetal State Assessment: Category I - tracings are normal. ? ?Physical Exam ?Cardiovascular:  ?   Rate and Rhythm: Normal rate.  ?Pulmonary:  ?   Effort: Pulmonary effort is normal.  ?  ?Prenatal labs: ?ABO, Rh: --/--/PENDING (04/15 1433) ?Antibody: PENDING (04/15 1433) ?Rubella:   ?RPR:    ?HBsAg:    ?HIV:    ?GBS:    ? ?Assessment/Plan: ?41 yo G2P1 @ 35 0/7 weeks in PTL refractory to tocolysis ?D/W patient-will proceed to delivery ?She desires repeat cesarean section. D/W procedure and risks including infection, organ damage, bleeding/transfusion-HIV/Hep, DVT/PE, pneumonia, wound breakdown. She states she understands and agrees. ?BMZ for fetal lung maturity  ? ?[name] II ?05/20/2021, 2:47 PM ? ? ? ? ?

## 2021-05-20 NOTE — Op Note (Signed)
NAME: Elizabeth Cannon, Elizabeth K. ?MEDICAL RECORD NO: [id number] ?ACCOUNT NO: [id number] ?DATE OF BIRTH: [birth year] ?FACILITY: [facility] ?LOCATION: [facility]-5SC ?PHYSICIAN: Guy Sandifer. Arleta Creek, MD ? ?Operative Report  ? ?DATE OF PROCEDURE: 05/20/2021 ? ?PREOPERATIVE DIAGNOSES:   ?1.  A 35 and 0/7th weeks. ?2.  Previous cesarean section, desires repeat. ?3.  Preterm labor. ? ?POSTOPERATIVE DIAGNOSES:   ?1.  A 35 and 0/7th weeks. ?2.  Previous cesarean section, desires repeat. ?3.  Preterm labor. ? ?PROCEDURE:  Repeat low transverse cesarean section. ? ?SURGEON:  Harold Hedge, MD ? ?ASSISTANT:  Coralie Keens, M.D. ? ?ESTIMATED BLOOD LOSS:  Per anesthesia note. ? ?FINDINGS:  Viable female infant, Apgars, arterial cord pH, birth weight pending. ? ?SPECIMENS:  Placenta to pathology. ? ?INDICATIONS AND CONSENT:  This patient is a 41 year old G2, P1 at 40 and 0/7 weeks, who presents in preterm labor.  She has strong contractions with cervical change that is refractory to attempts at tocolysis.  She desires repeat cesarean section for  ?delivery.  Betamethasone is administered. The potential risks and complications are reviewed preoperatively including but not limited to infection, organ damage, bleeding requiring transfusion of blood products with HIV and hepatitis acquisition, DVT,  ?PE, pneumonia, wound breakdown.  She states she understands and agrees and consent is signed on the chart. ? ?DESCRIPTION OF PROCEDURE:  The patient was taken to the operating room where she was identified.  Spinal anesthetic was placed per anesthesiology and she was placed in the dorsal supine position with a 15-degree left lateral wedge.  She was prepped  ?vaginally with Betadine.  Foley catheter was placed and prepped abdominally with ChloraPrep.  Timeout was done.  After 3-minute drying time, she was draped in a sterile fashion.  After testing for adequate spinal anesthesia, skin was entered through the  ?Pfannenstiel scar.  Dissection was carried out in  layers to the peritoneum, which was extended superiorly and inferiorly.  Vesicouterine peritoneum was taken down bilaterally.  The bladder flap was created.  Bladder blade was placed.  Uterus was incised  ?in a low transverse manner and the uterine cavity was entered bluntly with a hemostat.  Clear fluid was noted.  The incision was extended bilaterally with the fingers.  Vertex was then delivered and the baby was then delivered without difficulty.  Good  ?cry and tone is noted.  After 1 minute, cord was clamped and cut and the baby was handed to the waiting pediatrics team.  Placenta was delivered with uterine massage.  The uterine cavity is clean.  Uterus was closed in 2 running locking imbricating  ?layers of 0 Monocryl suture.  An additional figure-of-eight was placed in the center of the incision to obtain complete hemostasis.  Lavage was carried out and all returned as clear.  Anterior peritoneum was closed in a running fashion with 0 Monocryl  ?suture, which was also used to reapproximate the pyramidalis muscle in the midline.  Anterior rectus fascia was closed in a running fashion with a 0 looped PDS.  Subcutaneous layer was closed with interrupted plain and the skin was closed in a  ?subcuticular fashion with 4-0 Vicryl on a Keith needle.  Benzoin, Steri-Strips, honeycomb dressing is applied.  All counts were correct.  The patient was taken to the recovery room in stable condition. ? ? ?MUK ?D: 05/20/2021 5:20:39 pm T: 05/20/2021 10:42:00 pm  ?JOB: [id number]/ [id number]  ?

## 2021-05-20 NOTE — Anesthesia Preprocedure Evaluation (Signed)
Anesthesia Evaluation  ?Patient identified by MRN, date of birth, ID band ?Patient awake ? ? ? ?Reviewed: ?Allergy & Precautions, NPO status , Patient's Chart, lab work & pertinent test results ? ?Airway ?Mallampati: II ? ?TM Distance: >3 FB ?Neck ROM: Full ? ? ? Dental ?no notable dental hx. ? ?  ?Pulmonary ?neg pulmonary ROS,  ?  ?Pulmonary exam normal ? ? ? ? ? ? ? Cardiovascular ?negative cardio ROS ? ? ?Rhythm:Regular Rate:Normal ? ? ?  ?Neuro/Psych ?negative neurological ROS ? negative psych ROS  ? GI/Hepatic ?negative GI ROS, Neg liver ROS,   ?Endo/Other  ?negative endocrine ROS ? Renal/GU ?negative Renal ROS  ? ?  ?Musculoskeletal ?negative musculoskeletal ROS ?(+)  ? Abdominal ?Normal abdominal exam  (+)   ?Peds ? Hematology ?negative hematology ROS ?(+)   ?Anesthesia Other Findings ? ? Reproductive/Obstetrics ?(+) Pregnancy ?Prior C/S ? ?  ? ? ? ? ? ? ? ? ? ? ? ? ? ?  ?  ? ? ? ? ? ? ? ? ?Anesthesia Physical ?Anesthesia Plan ? ?ASA: 2 ? ?Anesthesia Plan: Spinal  ? ?Post-op Pain Management:   ? ?Induction:  ? ?PONV Risk Score and Plan: 2 and Treatment may vary due to age or medical condition ? ?Airway Management Planned: Natural Airway, Simple Face Mask and Nasal Cannula ? ?Additional Equipment: None ? ?Intra-op Plan:  ? ?Post-operative Plan:  ? ?Informed Consent: I have reviewed the patients History and Physical, chart, labs and discussed the procedure including the risks, benefits and alternatives for the proposed anesthesia with the patient or authorized representative who has indicated his/her understanding and acceptance.  ? ? ? ?Dental advisory given ? ?Plan Discussed with: CRNA ? ?Anesthesia Plan Comments:   ? ? ? ? ? ? ?Anesthesia Quick Evaluation ? ?

## 2021-05-20 NOTE — MAU Provider Note (Signed)
?History  ?  ? ?CSN: [id number] ? ?Arrival date and time: 05/20/21 1100 ? ? Event Date/Time  ? First Provider Initiated Contact with Patient 05/20/21 1134   ?  ? ?Chief Complaint  ?Patient presents with  ? Contractions  ? Rupture of Membranes  ? Vaginal Discharge  ? ?HPI ?Elizabeth Cannon is a 41 y.o. G2P1001 at [redacted]w[redacted]d who presents with vaginal bleeding and contractions. She states she was getting ready around 0700-0800 this am when she noticed the contractions became more frequent. She reports the pain moved to the front and lower part of her abdomen and now feels like menstrual cramps. She reports one episode of bleeding where it gushed out in her underwear. Since then, she has seen bright red spotting when she wipes. She reports the contractions now feel like they are every 4-5 minutes. She reports normal fetal movement.  ? ?She was seen in MAU on 4/12 for rule out rupture and contractions. She was closed at that time and elected to go home without tocolysis because the contractions were not painful.  ? ?OB History   ? ? Gravida  ?2  ? Para  ?1  ? Term  ?1  ? Preterm  ?   ? AB  ?   ? Living  ?1  ?  ? ? SAB  ?   ? IAB  ?   ? Ectopic  ?   ? Multiple  ?0  ? Live Births  ?1  ?   ?  ?  ? ? ?Past Medical History:  ?Diagnosis Date  ? Medical history non-contributory   ? ? ?Past Surgical History:  ?Procedure Laterality Date  ? CESAREAN SECTION N/A 06/01/2019  ? Procedure: CESAREAN SECTION;  Surgeon: Ranae Pila, MD;  Location: [facility] LD ORS;  Service: Obstetrics;  Laterality: N/A;  ? ? ?Family History  ?Problem Relation Age of Onset  ? Hyperlipidemia Father   ? Stroke Father   ? Hypertension Father   ? Diabetes Father   ? ? ?Social History  ? ?Tobacco Use  ? Smoking status: Never  ? Smokeless tobacco: Never  ?Substance Use Topics  ? Alcohol use: Never  ? Drug use: Never  ? ? ?Allergies: No Known Allergies ? ?Medications Prior to Admission  ?Medication Sig Dispense Refill Last Dose  ? Prenatal Vit-Fe Fumarate-FA  (PRENATAL MULTIVITAMIN) TABS tablet Take 1 tablet by mouth daily at 12 noon.   05/19/2021  ? ? ?Review of Systems  ?Constitutional: Negative.  Negative for fatigue and fever.  ?HENT: Negative.    ?Respiratory: Negative.  Negative for shortness of breath.   ?Cardiovascular: Negative.  Negative for chest pain.  ?Gastrointestinal:  Positive for abdominal pain. Negative for constipation, diarrhea, nausea and vomiting.  ?Genitourinary:  Positive for vaginal bleeding. Negative for dysuria and vaginal discharge.  ?Neurological: Negative.  Negative for dizziness and headaches.  ?Physical Exam  ? ?Blood pressure 104/69, pulse 78, temperature 97.7 ?F (36.5 ?C), temperature source Oral, resp. rate 15, SpO2 100 %, unknown if currently breastfeeding. ?Patient Vitals for the past 24 hrs: ? BP Temp Temp src Pulse Resp SpO2  ?05/20/21 1306 -- 98.3 ?F (36.8 ?C) Oral -- -- --  ?05/20/21 1245 (!) 92/55 -- -- (!) 108 -- --  ?05/20/21 1244 (!) 93/56 -- -- (!) 107 -- 100 %  ?05/20/21 1212 109/65 -- -- 83 -- 100 %  ?05/20/21 1131 104/69 -- -- 78 -- 100 %  ?05/20/21 1112 117/62 97.7 ?F (36.5 ?C) Oral  72 15 100 %  ?  ?Physical Exam ?Vitals and nursing note reviewed.  ?Constitutional:   ?   General: She is not in acute distress. ?   Appearance: She is well-developed.  ?HENT:  ?   Head: Normocephalic.  ?Eyes:  ?   Pupils: Pupils are equal, round, and reactive to light.  ?Cardiovascular:  ?   Rate and Rhythm: Normal rate and regular rhythm.  ?   Heart sounds: Normal heart sounds.  ?Pulmonary:  ?   Effort: Pulmonary effort is normal. No respiratory distress.  ?   Breath sounds: Normal breath sounds.  ?Abdominal:  ?   General: Bowel sounds are normal. There is no distension.  ?   Palpations: Abdomen is soft.  ?   Tenderness: There is no abdominal tenderness.  ?Skin: ?   General: Skin is warm and dry.  ?Neurological:  ?   Mental Status: She is alert and oriented to person, place, and time.  ?Psychiatric:     ?   Mood and Affect: Mood normal.     ?    Behavior: Behavior normal.     ?   Thought Content: Thought content normal.     ?   Judgment: Judgment normal.  ? ?Fetal Tracing: ? ?Baseline: 130 ?Variability: moderate ?Accels: 15x15 ?Decels: none ? ?Toco: 4-5 ? ? ?MAU Course  ?Procedures ?Results for orders placed or performed during the hospital encounter of 05/20/21 (from the past 24 hour(s))  ?Fern Test     Status: None  ? Collection Time: 05/20/21 11:54 AM  ?Result Value Ref Range  ? POCT Fern Test Negative = intact amniotic membranes   ?Urinalysis, Routine w reflex microscopic     Status: Abnormal  ? Collection Time: 05/20/21 12:32 PM  ?Result Value Ref Range  ? Color, Urine YELLOW YELLOW  ? APPearance HAZY (A) CLEAR  ? Specific Gravity, Urine 1.014 1.005 - 1.030  ? pH 7.0 5.0 - 8.0  ? Glucose, UA NEGATIVE NEGATIVE mg/dL  ? Hgb urine dipstick LARGE (A) NEGATIVE  ? Bilirubin Urine NEGATIVE NEGATIVE  ? Ketones, ur NEGATIVE NEGATIVE mg/dL  ? Protein, ur NEGATIVE NEGATIVE mg/dL  ? Nitrite NEGATIVE NEGATIVE  ? Leukocytes,Ua NEGATIVE NEGATIVE  ? RBC / HPF >50 (H) 0 - 5 RBC/hpf  ? WBC, UA 0-5 0 - 5 WBC/hpf  ? Bacteria, UA RARE (A) NONE SEEN  ? Squamous Epithelial / LPF 11-20 0 - 5  ?  ?MDM ?Prenatal records from private office reviewed. Pregnancy complicated by previous c/s, AMA and IVF pregnancy. ?Labs ordered and reviewed. ? ?UA ?Cervix 1cm/thick/-3 now from previous exam of closed by same CNM on 4/12. Now with bloody show ? ?LR bolus ?Procardia x1- unable to give further doses of procardia due to maternal hypotension after first dose ?Risks and benefits of terbutaline reviewed at length. Patient agreeable to plan of care ?Terbutaline ? ?Observed x2 hours- contractions now 6-10 minutes but patient reports they are just as painful as before.  ?Cervix now 1.5cm/80/-1. Bloody show still present ? ?Consulted with Dr. Henderson Cloud given presentation, interventions and exam- recommends repeat c/s due to preterm labor with no response to interventions. Will come see  patient in MAU ? ?Assessment and Plan  ?-Preterm labor ?-[redacted] weeks gestation ? ?-Prepare for OR ?-Care turned over to MD ? ?Rolm Bookbinder CNM ?05/20/2021, 11:34 AM  ?

## 2021-05-21 LAB — CBC
HCT: 34.4 % — ABNORMAL LOW (ref 36.0–46.0)
Hemoglobin: 11.5 g/dL — ABNORMAL LOW (ref 12.0–15.0)
MCH: 30.4 pg (ref 26.0–34.0)
MCHC: 33.4 g/dL (ref 30.0–36.0)
MCV: 91 fL (ref 80.0–100.0)
Platelets: 165 10*3/uL (ref 150–400)
RBC: 3.78 MIL/uL — ABNORMAL LOW (ref 3.87–5.11)
RDW: 13.3 % (ref 11.5–15.5)
WBC: 13.3 10*3/uL — ABNORMAL HIGH (ref 4.0–10.5)
nRBC: 0 % (ref 0.0–0.2)

## 2021-05-21 LAB — RPR: RPR Ser Ql: NONREACTIVE

## 2021-05-21 NOTE — Lactation Note (Signed)
This note was copied from a baby's chart. ?Lactation Consultation Note ? ?Patient Name: Elizabeth Cannon ?Today's Date: 05/21/2021 ?Reason for consult: Follow-up assessment;NICU baby;Mother's request;Breastfeeding assistance ?Age:39 hours ? ?Visited with mom of 22 hours old [name] for feeding assist. LC assisted on both, cross cradle (L) and football hold (R) but baby was still sleepy, he'd suck on and off though (see LATCH score). Revised normal LPI behavior, feeding cues, IDF 1/2 and readiness. Left mom and baby doing STS care; this was her first time holding baby. ? ?Maternal Data ?Has patient been taught Hand Expression?: Yes ?Does the patient have breastfeeding experience prior to this delivery?: Yes ?How long did the patient breastfeed?: 12 months ? ?Feeding ?Mother's Current Feeding Choice: Breast Milk and Donor Milk ? ?LATCH Score ?Latch: Repeated attempts needed to sustain latch, nipple held in mouth throughout feeding, stimulation needed to elicit sucking reflex. (Baby required continuous stimulation to wake up in order to suckle at the breast and on gloved finger) ? ?Audible Swallowing: None ? ?Type of Nipple: Everted at rest and after stimulation ? ?Comfort (Breast/Nipple): Soft / non-tender ? ?Hold (Positioning): Assistance needed to correctly position infant at breast and maintain latch. ? ?LATCH Score: 6 ? ?Lactation Tools Discussed/Used ?Tools: Pump;Flanges;Coconut oil ?Flange Size: 24 ?Breast pump type: Double-Electric Breast Pump ?Pump Education: Setup, frequency, and cleaning;Milk Storage ?Reason for Pumping: LPI in NICU ?Pumping frequency: q 3 hours (recommended) ?Pumped volume: 2 mL ? ?Interventions ?Interventions: Assisted with latch;Skin to skin;Breast massage;Hand express;Breast compression;Adjust position;Support pillows;Position options ? ?Plan of care ?Encouraged mom to start pumping consistently every 3 hours, at least 8 pumping sessions/24 hours ?Hand expression, breast massage and  coconut oil were also encouraged prior pumping ?LC will check tomorrow with secretary to see when the next shipment of [employer] employee pumps is cominng ?Ms. Podgorski will start taking baby to breast on feeding cues for some "lick & learn" ?  ?FOB present. All questions and concerns answered, parents to contact LC services PRN. ? ?Discharge ?Pump: DEBP ? ?Consult Status ?Consult Status: Follow-up ?Date: 05/21/21 ?Follow-up type: In-patient ? ? ?Linsie Lupo S Arlisa Leclere ?05/21/2021, 3:14 PM ? ? ? ?

## 2021-05-21 NOTE — Progress Notes (Signed)
Subjective: ?Postpartum Day 1: Cesarean Delivery ?Patient reports tolerating PO and no problems voiding.   ? ?Objective: ?Vital signs in last 24 hours: ?Temp:  [97.7 ?F (36.5 ?C)-98.5 ?F (36.9 ?C)] 98.4 ?F (36.9 ?C) (04/16 0800) ?Pulse Rate:  [57-108] 64 (04/16 0800) ?Resp:  [11-20] 20 (04/16 0330) ?BP: (89-117)/(51-76) 112/64 (04/16 0800) ?SpO2:  [98 %-100 %] 100 % (04/16 0800) ? ?Physical Exam:  ?General: alert, cooperative, and no distress ?Lochia: appropriate ?Uterine Fundus: firm ?Incision: healing well ?DVT Evaluation: No evidence of DVT seen on physical exam. ? ?Recent Labs  ?  05/20/21 ?1433 05/21/21 ?0431  ?HGB 11.6* 11.5*  ?HCT 34.5* 34.4*  ? ? ?Assessment/Plan: ?Status post Cesarean section. Doing well postoperatively.  ?Continue current care. ?Baby in NICU ?Elizabeth Cannon ?05/21/2021, 9:09 AM ? ? ?

## 2021-05-21 NOTE — Lactation Note (Signed)
This note was copied from a baby's chart. ?Lactation Consultation Note ? ?Patient Name: Elizabeth Cannon ?Today's Date: 05/21/2021 ?Reason for consult: Initial assessment;NICU baby;Infant < 6lbs;Late-preterm 34-36.6wks;Other (Comment) (AMA) ?Age:39 hours ? ?Visited with mom of 65 hours old [name], she's a P2 but has limited experience with direct breastfeeding. She's already pumping and reports is going well, but she's been getting most of her volume through hand expression, praised her for her efforts. Reviewed lactogenesis II, pumping schedule, pumping log, expectations, storage guidelines and IDF 1/2. Elizabeth Cannon voiced that she lost one of the while membranes of her pump, provided a hand pump in order to have additional ones. She's a [employer] employee and will be getting her DEBP on discharge, she politely declined the pumps we have in stock today, she prefers to wait for a [employer]. ? ?Maternal Data ?Has patient been taught Hand Expression?: Yes ?Does the patient have breastfeeding experience prior to this delivery?: Yes ?How long did the patient breastfeed?: 12 months (but only attempted feeding at the breast for a few weeks due to nipple trauma) ? ?Feeding ?Mother's Current Feeding Choice: Breast Milk and Donor Milk ? ?Lactation Tools Discussed/Used ?Tools: Pump;Flanges;Coconut oil ?Flange Size: 24 ?Breast pump type: Double-Electric Breast Pump ?Pump Education: Setup, frequency, and cleaning;Milk Storage ?Reason for Pumping: LPI in NICU ?Pumping frequency: q 3 hours (recommended) ?Pumped volume: 2 mL ? ?Interventions ?Interventions: Breast feeding basics reviewed;Coconut oil;DEBP;Education;"The NICU and Your Baby" book;LC Services brochure;Infant Driven Feeding Algorithm education ? ?Plan of care ?Encouraged mom to start pumping consistently every 3 hours, at least 8 pumping sessions/24 hours ?Hand expression, breast massage and coconut oil were also encouraged prior pumping ?LC will check tomorrow with  secretary to see when the next shipment of [employer] employee pumps is cominng ? ?No other support person at this time. All questions and concerns answered, Elizabeth Cannon to contact [facility] services PRN. ? ?Discharge ?DEBP (needs employee pump, but wants to wait until [employer] pumps are back in stock) ? ?Consult Status ?Consult Status: Follow-up ?Date: 05/21/21 ?Follow-up type: In-patient ? ? ?Elizabeth Cannon ?05/21/2021, 12:47 PM ? ? ? ?

## 2021-05-22 ENCOUNTER — Encounter (HOSPITAL_COMMUNITY): Payer: Self-pay | Admitting: Obstetrics and Gynecology

## 2021-05-22 NOTE — Lactation Note (Signed)
This note was copied from a baby's chart. ?Lactation Consultation Note ? ?Patient Name: Elizabeth Cannon ?Today's Date: 05/22/2021 ?Reason for consult: Follow-up assessment;NICU baby;Infant < 6lbs;Late-preterm 34-36.6wks;Other (Comment) ([employer] employee, [place]) ?Age:41 years ? ?Visited with mom of 66 hours old [name], she's a P2 and reports pumping is going well, her milk is in the process of coming in, advised to Elizabeth Cannon to start using the expression setting on her pump. She reported she's still not pumping every 3 hours, but every 4 instead, reminded her how her body will down regulate milk production if her pumping sessions are too spaced out. ?Elizabeth Cannon tried putting baby to breast again last night but baby "Elizabeth Cannon" is not quite ready yet. Reviewed benefits of pre-feeding activities for NICU babies while they're learning to breastfeed.  ? ?Maternal Data ? Maternal supply is WNL ? ?Feeding ?Mother's Current Feeding Choice: Breast Milk and Formula ?Nipple Type: Nfant Slow Flow (purple) ? ?Lactation Tools Discussed/Used ?Tools: Pump;Flanges;Coconut oil ?Flange Size: 24 ?Breast pump type: Double-Electric Breast Pump ?Pump Education: Setup, frequency, and cleaning;Milk Storage ?Reason for Pumping: LPI in NICU ?Pumping frequency: 6 times/24 hours ?Pumped volume: 20 mL ? ?Interventions ?Interventions: Breast feeding basics reviewed;Coconut oil;DEBP;Education ? ?Plan of care ?Encouraged mom to start pumping consistently every 3 hours, at least 8 pumping sessions/24 hours; use expression setting ?Hand expression, breast massage and coconut oil were also encouraged prior pumping ?Parents will start working on some pre-feeding activities such as STS care during gavage feedings, paci dips and putting baby to breast on feeding cues for some "lick & learn" ?Employee pump is pending until next shipment of [place] arrive ?  ?No other support person at this time. All questions and concerns answered, Elizabeth Cannon to  contact [facility] services PRN. ? ?Discharge ?Discharge Education: Engorgement and breast care ?Pump: DEBP (MOB is a [employer] employee but will wait until the next shipment of [employer]ata pumps come in to get her employee pump) ? ?Consult Status ?Consult Status: Follow-up ?Date: 05/22/21 ?Follow-up type: In-patient ? ? ?Graclynn Vanantwerp S Kartik Fernando ?05/22/2021, 1:25 PM ? ? ? ?

## 2021-05-22 NOTE — Progress Notes (Signed)
Postpartum Progress Note ? ?Postpartum Day 2 s/p repeat Cesarean section. ? ?Subjective: ? ?Patient reports no overnight events.  She reports well controlled pain, ambulating without difficulty, voiding spontaneously, tolerating PO.  She reports Positive flatus, Negative BM.  Vaginal bleeding is minimal. ? ?Objective: ?Blood pressure 98/72, pulse 65, temperature 97.9 ?F (36.6 ?C), temperature source Oral, resp. rate 20, SpO2 100 %, unknown if currently breastfeeding. ? ?Physical Exam:  ?General: alert and no distress ?Lochia: appropriate ?Uterine Fundus: firm ?Incision: dressing in place ?DVT Evaluation: No evidence of DVT seen on physical exam. ? ?Recent Labs  ?  05/20/21 ?1433 05/21/21 ?0431  ?HGB 11.6* 11.5*  ?HCT 34.5* 34.4*  ? ? ?Assessment/Plan: ?Postpartum Day 2, s/p C-section ?Baby boy in NICU ?Lactation following ?Doing well, continue routine postpartum care. Anticipate discharge tomorrow ? ? LOS: 2 days  ? ?Lyn Henri ?05/22/2021, 7:33 AM  ? ? ?

## 2021-05-22 NOTE — Progress Notes (Signed)
Patient screened out for psychosocial assessment since none of the following apply:  Psychosocial stressors documented in mother or baby's chart  Gestation less than 32 weeks  Code at delivery   Infant with anomalies Please contact the Clinical Social Worker if specific needs arise, by MOB's request, or if MOB scores greater than 9/yes to question 10 on Edinburgh Postpartum Depression Screen.  Kaan Tosh, LCSW Clinical Social Worker [facility] Cell#: [phone]     

## 2021-05-23 LAB — SURGICAL PATHOLOGY

## 2021-05-23 NOTE — Progress Notes (Signed)
Subjective: ?Postpartum Day 3: Cesarean Delivery ?Patient reports incisional pain, tolerating PO, and no problems voiding but complains of come bladder spasm with urination. No dysuria ? ?Objective: ?Vital signs in last 24 hours: ?Temp:  [98.6 ?F (37 ?C)-98.8 ?F (37.1 ?C)] 98.6 ?F (37 ?C) (04/18 7893) ?Pulse Rate:  [73-78] 73 (04/18 8101) ?Resp:  [16-18] 18 (04/18 7510) ?BP: (90-123)/(54-72) 90/54 (04/18 2585) ?SpO2:  [99 %] 99 % (04/18 2778) ? ?Physical Exam:  ?General: alert, cooperative, appears stated age, and mild distress ?Lochia: appropriate ?Uterine Fundus: firm ?Incision: healing well ?DVT Evaluation: No evidence of DVT seen on physical exam. ? ?Recent Labs  ?  05/20/21 ?1433 05/21/21 ?0431  ?HGB 11.6* 11.5*  ?HCT 34.5* 34.4*  ? ? ?Assessment/Plan: ?Status post Cesarean section. Doing well postoperatively. ?Baby in NICU.    ?Continue present care and anticipate DC tomorrow. ? ?Elizabeth Cannon ?05/23/2021, 10:28 AM ? ? ?

## 2021-05-23 NOTE — Lactation Note (Signed)
This note was copied from a baby's chart. ?Lactation Consultation Note ? ?Patient Name: Elizabeth Cannon ?Today's Date: 05/23/2021 ?Reason for consult: Follow-up assessment;NICU baby;Infant < 6lbs;Late-preterm 34-36.6wks;Other (Comment) (AMA, [employer] employee) ?Age:39 hours ? ?Visited with mom of 20 hours old [name], she's a P2 and requested a feeding assist. LC took baby "Elizabeth Cannon" STS to mother's left breast in cross cradle hold, she already had baby positioned but assisted with repositioning for a deeper latch and also aligned NS # 20. Baby was sleepy and engaged in some suckling on and off but this feeding had to be d/c due to stress signs, baby got the hiccups (see LATCH score). ? ?Reviewed normalcy and expectations at this age, provided Elizabeth Cannon an update on her employee pump, she'll contact LC services this week to see if the next shipment arrives; she's expecting to be discharged tomorrow. ? ?Maternal Data ? Maternal supply continues to slowly increase and is WNL ? ?Feeding ?Mother's Current Feeding Choice: Breast Milk and Formula ? ?LATCH Score ?Latch: Repeated attempts needed to sustain latch, nipple held in mouth throughout feeding, stimulation needed to elicit sucking reflex. (sleepy baby, a few sucks on and off with NS # 20, only NNS during this feeding.) ? ?Audible Swallowing: None (but pool of EBM on NS noted, baby had difficulty swallowing the bolus, discontinued feeding as soon as he got the hiccups.) ? ?Type of Nipple: Everted at rest and after stimulation ? ?Comfort (Breast/Nipple): Soft / non-tender ? ?Hold (Positioning): Assistance needed to correctly position infant at breast and maintain latch. ? ?LATCH Score: 6 ? ?Lactation Tools Discussed/Used ?Tools: Pump;Flanges;Nipple Dorris Carnes ?Nipple shield size: 20 ?Flange Size: 24 ?Breast pump type: Double-Electric Breast Pump ?Pump Education: Setup, frequency, and cleaning;Milk Storage ?Reason for Pumping: LPI in NICU ?Pumping frequency: 6 times/24  hours ?Pumped volume: 25 mL (25-35 ml) ? ?Interventions ?Interventions: Coconut oil;Assisted with latch;Skin to skin;Breast compression;Adjust position;Support pillows;DEBP;Education ? ?Plan of care ?Encouraged mom to start pumping consistently every 3 hours, at least 8 pumping sessions/24 hours using expression setting ?Hand expression, breast massage and coconut oil were also encouraged prior pumping ?Parents will start working on some pre-feeding activities such as STS care during gavage feedings, paci dips and putting baby to breast on feeding cues for some "lick & learn" ?Employee pump is pending until next shipment of Medela Sonatas arrive ?  ?No other support person at this time. All questions and concerns answered, Elizabeth Cannon to contact [facility] services PRN. ? ?Discharge ?Pump: DEBP (Elizabeth Cannon will be discharged tomorrow 05/24/21 and will wait for the next shipment of Sonata pumps to arrive) ? ?Consult Status ?Consult Status: Follow-up ?Date: 05/23/21 ?Follow-up type: In-patient ? ? ?Elizabeth Cannon ?05/23/2021, 1:00 PM ? ? ? ?

## 2021-05-24 NOTE — Lactation Note (Signed)
This note was copied from a baby's chart. ?Lactation Consultation Note ? ?Patient Name: Elizabeth Cannon ?Today's Date: 05/24/2021 ?Reason for consult: Follow-up assessment;NICU baby ?Age:39 days ? ?LC assisted mom with latch. Helped place NS on L breast and undressed baby. Mom placed baby in cradle position on L breast, but baby was unable to sustain latch. LC encouraged mom to do STS. Nurse printed mom more labels and student issued more bottles to mom. LC and student praised mom for her efforts.  ? ?Mom encouraged to continue current feeding plan for infant.  ? ?Feeding ?Mother's Current Feeding Choice: Breast Milk and Formula ? ?LATCH Score ?Latch: Too sleepy or reluctant, no latch achieved, no sucking elicited. ? ?Audible Swallowing: None ? ?Type of Nipple: Everted at rest and after stimulation ? ?Comfort (Breast/Nipple): Soft / non-tender ? ?Hold (Positioning): Assistance needed to correctly position infant at breast and maintain latch. ? ?LATCH Score: 5 ? ? ?Interventions ?Interventions: Support pillows;Education ? ?Consult Status ?Consult Status: Follow-up ?Date: 05/24/21 ?Follow-up type: In-patient ? ? ? ?Hassani Sliney Robinson-Sullivan ?05/24/2021, 3:50 PM ? ? ? ?

## 2021-05-24 NOTE — Lactation Note (Signed)
This note was copied from a baby's chart. ?Lactation Consultation Note ? ?Patient Name: Elizabeth Cannon ?Today's Date: 05/24/2021 ?Reason for consult: Follow-up assessment;NICU baby ?Age:39 days ? ?Mom expressed that she had a clogged duct this morning. She performed breast massages to relieve clogged ducts. Student and mom dicussed engorgement prevention and pumping frequency. Student encouraged mom to stimulate breast 8-12/day.  ? ?Mom expressed that PT and SLP will be at feeding at 12 today. Requested that lactation doesn't come during that time, but would like LC to come back at a later time.  ? ? ?Plan:  ?Continue to stimulate breast 8-12x/day ?Encourage using hand expression and breast massage ?Employee pump, Medela Sonatas, pending  ?Call [facility] for future assistance  ? ? ?Lactation Tools Discussed/Used ?Pumped volume: 55 mL ? ?Interventions ?Interventions: Education;Breast feeding basics reviewed ? ? ?Consult Status ?Consult Status: Follow-up ?Date: 05/24/21 ?Follow-up type: In-patient ? ? ? ?Baley Shands Robinson-Sullivan ?05/24/2021, 10:34 AM ? ? ? ?

## 2021-05-24 NOTE — Discharge Summary (Signed)
? ?  Postpartum Discharge Summary ? ?Date of Service May 24, 2021 ? ?   ?Patient Name: Elizabeth Cannon ?DOB: 12/17/1980 ?MRN: [id number] ? ?Date of admission: 05/20/2021 ?Delivery date:05/20/2021  ?Delivering provider: Everlene Farrier  ?Date of discharge: 05/24/2021 ? ?Admitting diagnosis: Preterm labor [O60.00] ?Cesarean delivery delivered [O82] ?Intrauterine pregnancy: [redacted]w[redacted]d    ?Secondary diagnosis:  Principal Problem: ?  Preterm labor ?Active Problems: ?  Cesarean delivery delivered ? ?Additional problems: none    ?Discharge diagnosis: Preterm Pregnancy Delivered                                              ?Post partum procedures: none ?Augmentation: N/A ?Complications: None ? ?Hospital course: Onset of Labor With Unplanned C/S   ?41y.o. yo G2P1102 at 382w0das admitted in Latent Labor on 05/20/2021. Patient had a labor course significant for pre term contractions . The patient went for cesarean section due to  PTL . Delivery details as follows: ?Membrane Rupture Time/Date:  ,   ?Delivery Method:C-Section, Low Transverse  ?Details of operation can be found in separate operative note. Patient had an uncomplicated postpartum course.  She is ambulating,tolerating a regular diet, passing flatus, and urinating well.  Patient is discharged home in stable condition 05/24/21. ? ?Newborn Data: ?Birth date:05/20/2021  ?Birth time:4:52 PM  ?Gender:Female  ?Living status:Living  ?Apgars:8 ,8  ?Weight:2380 g  ? ?Magnesium Sulfate received: No ?BMZ received: Yes ?Rhophylac:N/A ?MMR:N/A ?T-DaP:Given prenatally ?Flu: N/A ?Transfusion:No ? ?Physical exam  ?Vitals:  ? 05/23/21 0652 05/23/21 1635 05/23/21 2046 05/24/21 0547  ?BP: (!) 90/54 104/74 109/71 106/66  ?Pulse: 73 74 69 64  ?Resp: _0 ?Temp: 98.6 ?F (37 ?C) 98.7 ?F (37.1 ?C) 98.6 ?F (37 ?C) 98.2 ?F (36.8 ?C)  ?TempSrc: Oral Oral Oral Axillary  ?SpO2: 99%  100% 100%  ? ?General: alert, cooperative, and no distress ?Lochia: appropriate ?Uterine Fundus: firm ?Incision:  Healing well with no significant drainage ?DVT Evaluation: No evidence of DVT seen on physical exam. ?Labs: ?Lab Results  ?Component Value Date  ? WBC 13.3 (H) 05/21/2021  ? HGB 11.5 (L) 05/21/2021  ? HCT 34.4 (L) 05/21/2021  ? MCV 91.0 05/21/2021  ? PLT 165 05/21/2021  ? ?   ? View : No data to display.  ?  ?  ?  ? ?Edinburgh Score: ? ?  05/21/2021  ?  8:02 AM  ?EdFlavia Shipperostnatal Depression Scale Screening Tool  ?I have been able to laugh and see the funny side of things. 0  ?I have looked forward with enjoyment to things. 0  ?I have blamed myself unnecessarily when things went wrong. 0  ?I have been anxious or worried for no good reason. 0  ?I have felt scared or panicky for no good reason. 0  ?Things have been getting on top of me. 1  ?I have been so unhappy that I have had difficulty sleeping. 0  ?I have felt sad or miserable. 0  ?I have been so unhappy that I have been crying. 0  ?The thought of harming myself has occurred to me. 0  ?Edinburgh Postnatal Depression Scale Total 1  ? ? ? ? ?After visit meds:  ?Allergies as of 05/24/2021   ?No Known Allergies ?  ? ?  ?Medication List  ?  ? ?STOP taking these medications   ? ?  omeprazole 20 MG tablet ?Commonly known as: PRILOSEC OTC ?  ? ?  ? ?TAKE these medications   ? ?acetaminophen 500 MG tablet ?Commonly known as: TYLENOL ?Take 500 mg by mouth every 6 (six) hours as needed for mild pain. ?  ?calcium carbonate 500 MG chewable tablet ?Commonly known as: TUMS - dosed in mg elemental calcium ?Chew 1,000 mg by mouth 2 (two) times daily as needed for indigestion or heartburn. ?  ?PRENATAL PO ?Take 1 tablet by mouth daily. ?  ? ?  ? ? ? ?Discharge home in stable condition ?Infant Feeding: Breast ?Infant Disposition: NICU ?Discharge instruction: per After Visit Summary and Postpartum booklet. ?Activity: Advance as tolerated. Pelvic rest for 6 weeks.  ?Diet: routine diet ?Anticipated Birth Control: Unsure ?Postpartum Appointment:1 week ?Additional Postpartum F/U: Incision  check 1 week ?Future Appointments:No future appointments. ?Follow up Visit: ? ? ?  ? ?05/24/2021 ?Cyril Mourning, MD ? ? ?

## 2021-05-25 ENCOUNTER — Ambulatory Visit: Payer: Self-pay

## 2021-05-25 NOTE — Lactation Note (Signed)
This note was copied from a baby's chart. ? ?NICU Lactation Consultation Note ? ?Patient Name: Elizabeth Cannon ?Today's Date: 05/25/2021 ?Age:39 days ? ? ?Subjective ?Reason for consult: Follow-up assessment ?Mother continues to pump q3. She denies s/s of engorgement. We reviewed IDF-2 and feeding expectations at 35+weeks. Mother continues to offer breast when she is here. She is aware of LC support to assist prn.  ? ?Mother is awaiting an Product/process development scientist pump. LC will deliver to room when it arrives.  ?Objective ?Infant data: ?Mother's Current Feeding Choice: Breast Milk ? ?Infant feeding assessment ?Scale for Readiness: 2 ?Scale for Quality: 4 ? ? ?  ?Maternal data: ?[id number]  ?C-Section, Low Transverse ?Pumping frequency: q3 ?Pumped volume: 70 mL ? ?Pump:  (patient waiting for a [employer] employee pump) ? ?Assessment ?Infant: ?LATCH Score: 5 ? ? ?Maternal: ?Milk volume: Normal ? ? ?Intervention/Plan ?Interventions: Education; Infant Driven Feeding Algorithm education ? ?Pump Education: Setup, frequency, and cleaning ? ?Plan: ?Consult Status: Follow-up ? ?NICU Follow-up type: Assist with IDF-2 (Mother does not need to pre-pump before breastfeeding) ? ?Mother to continue current poc ? ?Elder Negus ?05/25/2021, 10:23 AM ?

## 2021-05-26 ENCOUNTER — Ambulatory Visit: Payer: Self-pay

## 2021-05-26 NOTE — Lactation Note (Signed)
This note was copied from a baby's chart. ?Lactation Consultation Note ?Mother and infant continue practicing bf'ing. Mother is aware of LC services if needed this weekend.  ? ?Employee pump provided. No charge for brief encounter.  ? ?Patient Name: Elizabeth Cannon ?Today's Date: 05/26/2021 ?  ?Age:39 days ? ? ?Elder Negus ?05/26/2021, 3:40 PM ? ? ? ?

## 2021-05-29 ENCOUNTER — Ambulatory Visit: Payer: Self-pay

## 2021-05-29 NOTE — Lactation Note (Addendum)
This note was copied from a baby's chart. ?Lactation Consultation Note ? ?Patient Name: Elizabeth Cannon ?Today's Date: 05/29/2021 ?Reason for consult: Follow-up assessment;NICU baby;Late-preterm 34-36.6wks;Weekly NICU follow-up;Other (Comment);Infant < 6lbs (AMA, [employer] employee) ?Age:39 days ? ?Upon entering room, mom was finishing pumping session. Dad also in room. Mom pumped 130 ml from both breast. Mom asked when she would be able to stop using NS. Student explained that an LC would be able to determine if baby still needs NS and how to properly wean baby from NS whenever mom puts baby to breast. Mom stated that baby latch well with or without NS. LC recommended that mom could stop using it.  ? ?Mom expressed she had no pain or discomfort in breast at this time. Student answered all of moms questions and concerns.  Student praised mom for her efforts.  ? ? Plan:  ?Encouraged mom to start pumping consistently every 3 hours, at least 8 pumping sessions/24 hours using expression setting ?Encouraged to take baby to breast on feeding cues around feeding times. Using NS #20 PRN ?Family to contact [facility] services PRN ? ? ?Feeding ?Mother's Current Feeding Choice: Breast Milk and Formula ?Nipple Type: Nfant Extra Slow Flow (gold) ? ? ?Lactation Tools Discussed/Used ?Tools: Pump;Flanges ?Flange Size: 24 ?Breast pump type: Double-Electric Breast Pump ?Pump Education: Setup, frequency, and cleaning;Milk Storage ?Reason for Pumping: baby in NICU ?Pumping frequency: q3 ?Pumped volume: 130 mL ? ?Interventions ?Interventions: Education ? ?Discharge ?Pump: DEBP;Employee Pump ([employer] pump) ? ?Consult Status ?Consult Status: Follow-up ?Date: 05/29/21 ?Follow-up type: In-patient ? ? ? ?Gerald Stabs Robinson-Sullivan ?05/29/2021, 12:15 PM ? ? ? ?

## 2021-05-30 ENCOUNTER — Telehealth (HOSPITAL_COMMUNITY): Payer: Self-pay | Admitting: *Deleted

## 2021-05-30 NOTE — Telephone Encounter (Signed)
Mom reports feeling good. Incision healing well per mom. No concerns regarding herself at this time. EPDS=0 (hospital score=1) ?Mom reports baby is 'going in the right direction', but still in the NICU. ? ?Duffy Rhody, RN 05-30-2021 at 1:03pm ?

## 2021-06-02 ENCOUNTER — Ambulatory Visit: Payer: Self-pay

## 2021-06-02 NOTE — Lactation Note (Signed)
This note was copied from a baby's chart. ? ?NICU Lactation Consultation Note ? ?Patient Name: Elizabeth Cannon ?Today's Date: 06/02/2021 ?Age:39 days ? ? ?Subjective ?Reason for consult: Follow-up assessment; NICU baby; Late-preterm 34-36.6wks ? ? ?Lactation followed up with Elizabeth Cannon and 59 day old baby Elizabeth Cannon. His feeding plan was updated to ad lib today. I observed Elizabeth Cannon latch baby to the right breast in cradle position. I observed rhythmic suckling sequences.  ? ?I responded to questions about when to use the breast pump now that baby is ad lib. I educated on indications for effective breast feeding and transfer and recommended that she include several post-pumping sessions PRN contingent on baby's quality of feeding. We discussed signs that Elizabeth Cannon is transferring adequate volumes. I also emphasized the benefits of offering both breasts in a feeding. Elizabeth Cannon is able to teach back infant feeding cues and verbalizes strong understanding of cue-based feeding. ? ?Lactation to monitor progress of dyad and assist, as needed. ? ?Objective ?Infant data: ?Mother's Current Feeding Choice: Breast Milk ? ?Infant feeding assessment ?Scale for Readiness: 1 ?Scale for Quality: 2 ? ?Maternal data: ?[id number]  ?C-Section, Low Transverse ?Significant Breast History:: hx of "clogged milk ducts" ? ?Current breast feeding challenges:: NICU ? ?Pumping frequency: post pump during ad lib trial, as able/appropriate ?Pumped volume: 120 mL (120 - 200 mls - varies throughout the day) ? ?Pump: Personal Proofreader) ? ?Assessment ?Infant: ?LATCH Score: 9 ? ?Feeding Status: Ad lib ? ? ?Maternal: ?Milk volume: Normal ? ? ?Intervention/Plan ?Interventions: Education ? ?Pump Education: Setup, frequency, and cleaning ? ?Plan: ?Consult Status: Follow-up ? ?No data recorded ? ? ?Lenore Manner ?06/02/2021, 1:22 PM ?

## 2021-06-03 ENCOUNTER — Ambulatory Visit: Payer: Self-pay

## 2021-06-03 NOTE — Lactation Note (Signed)
This note was copied from a baby's chart. ? ?NICU Lactation Consultation Note ? ?Patient Name: Elizabeth Cannon ?Today's Date: 06/03/2021 ?Age:39 wk.o. ? ? ?Subjective ?Reason for consult: Follow-up assessment; NICU baby ? ?Lactation attempted to see Ms. Dentinger. Her husband was holding Agricultural engineer. Baby has been ad lib feeding since yesterday and doing well with PO feeding thus far. Baby received bottles overnight, but Ms. Hewlett is breastfeeding when she is present at NICU.  ? ?Follow up tomorrow in anticipation of discharge to discuss OP and community breastfeeding resources is recommended. ? ?Objective ?Infant data: ?Mother's Current Feeding Choice: Breast Milk and Formula ? ?Infant feeding assessment ?Scale for Readiness: 1 ?Scale for Quality: 2 ? ?Maternal data: ?[id number]  ?C-Section, Low Transverse ?Significant Breast History:: hx of "clogged milk ducts" ? ?Current breast feeding challenges:: NICU ? ?Pumping frequency: post pump during ad lib trial, as able/appropriate ?Pumped volume: 120 mL (120 - 200 mls - varies throughout the day) ? ? ?Pump: Personal Gaffer) ? ?Assessment ?Infant: ?LATCH Score: 10 ? ?Feeding Status: Ad lib ? ? ?Maternal: ?Milk volume: Normal ? ? ?Intervention/Plan ?Interventions: Education ? ?Pump Education: Setup, frequency, and cleaning ? ?Plan: ?Consult Status: Follow-up ? ?NICU Follow-up type: Weekly NICU follow up (discharge) ? ? ? ?Walker Shadow ?06/03/2021, 2:19 PM ?

## 2021-06-20 ENCOUNTER — Inpatient Hospital Stay (HOSPITAL_COMMUNITY): Admission: AD | Admit: 2021-06-20 | Payer: 59 | Source: Home / Self Care | Admitting: Obstetrics and Gynecology

## 2021-06-20 ENCOUNTER — Encounter (HOSPITAL_COMMUNITY): Admission: AD | Payer: Self-pay | Source: Home / Self Care

## 2021-06-20 SURGERY — Surgical Case
Anesthesia: Regional

## 2021-06-29 DIAGNOSIS — Z1389 Encounter for screening for other disorder: Secondary | ICD-10-CM | POA: Diagnosis not present

## 2021-12-04 ENCOUNTER — Other Ambulatory Visit: Payer: Self-pay

## 2021-12-04 MED ORDER — COVID-19 MRNA 2023-2024 VACCINE (COMIRNATY) 0.3 ML INJECTION
0.3000 mL | Freq: Once | INTRAMUSCULAR | 0 refills | Status: AC
  Filled 2021-12-04: qty 0.3, 1d supply, fill #0

## 2022-08-08 DIAGNOSIS — Z1231 Encounter for screening mammogram for malignant neoplasm of breast: Secondary | ICD-10-CM | POA: Diagnosis not present

## 2022-08-08 DIAGNOSIS — Z6823 Body mass index (BMI) 23.0-23.9, adult: Secondary | ICD-10-CM | POA: Diagnosis not present

## 2022-08-08 DIAGNOSIS — Z01419 Encounter for gynecological examination (general) (routine) without abnormal findings: Secondary | ICD-10-CM | POA: Diagnosis not present

## 2022-08-13 ENCOUNTER — Other Ambulatory Visit: Payer: Self-pay | Admitting: Obstetrics and Gynecology

## 2022-08-13 DIAGNOSIS — R928 Other abnormal and inconclusive findings on diagnostic imaging of breast: Secondary | ICD-10-CM

## 2022-08-22 ENCOUNTER — Ambulatory Visit: Admission: RE | Admit: 2022-08-22 | Payer: Commercial Managed Care - PPO | Source: Ambulatory Visit

## 2022-08-22 ENCOUNTER — Ambulatory Visit
Admission: RE | Admit: 2022-08-22 | Discharge: 2022-08-22 | Disposition: A | Discharge status: Home / Self Care | Payer: Commercial Managed Care - PPO | Source: Ambulatory Visit | Attending: Obstetrics and Gynecology | Admitting: Obstetrics and Gynecology

## 2022-08-22 DIAGNOSIS — R928 Other abnormal and inconclusive findings on diagnostic imaging of breast: Secondary | ICD-10-CM

## 2022-08-22 DIAGNOSIS — N6459 Other signs and symptoms in breast: Secondary | ICD-10-CM | POA: Diagnosis not present

## 2022-12-26 DIAGNOSIS — L7211 Pilar cyst: Secondary | ICD-10-CM | POA: Diagnosis not present

## 2022-12-26 DIAGNOSIS — L723 Sebaceous cyst: Secondary | ICD-10-CM | POA: Diagnosis not present

## 2023-01-11 ENCOUNTER — Other Ambulatory Visit: Payer: Self-pay

## 2023-01-11 MED ORDER — COVID-19 MRNA VAC-TRIS(PFIZER) 30 MCG/0.3ML IM SUSY
0.3000 mL | PREFILLED_SYRINGE | Freq: Once | INTRAMUSCULAR | 0 refills | Status: AC
  Filled 2023-01-11: qty 0.3, 1d supply, fill #0

## 2023-02-27 DIAGNOSIS — L7211 Pilar cyst: Secondary | ICD-10-CM | POA: Diagnosis not present

## 2023-03-13 DIAGNOSIS — Z4802 Encounter for removal of sutures: Secondary | ICD-10-CM | POA: Diagnosis not present

## 2023-03-13 DIAGNOSIS — T1490XD Injury, unspecified, subsequent encounter: Secondary | ICD-10-CM | POA: Diagnosis not present

## 2023-04-09 DIAGNOSIS — L720 Epidermal cyst: Secondary | ICD-10-CM | POA: Diagnosis not present

## 2023-08-21 DIAGNOSIS — Z6825 Body mass index (BMI) 25.0-25.9, adult: Secondary | ICD-10-CM | POA: Diagnosis not present

## 2023-08-21 DIAGNOSIS — Z124 Encounter for screening for malignant neoplasm of cervix: Secondary | ICD-10-CM | POA: Diagnosis not present

## 2023-08-21 DIAGNOSIS — Z01419 Encounter for gynecological examination (general) (routine) without abnormal findings: Secondary | ICD-10-CM | POA: Diagnosis not present

## 2023-11-13 DIAGNOSIS — Z1231 Encounter for screening mammogram for malignant neoplasm of breast: Secondary | ICD-10-CM | POA: Diagnosis not present

## 2023-12-11 DIAGNOSIS — H5213 Myopia, bilateral: Secondary | ICD-10-CM | POA: Diagnosis not present

## 2023-12-11 DIAGNOSIS — H52203 Unspecified astigmatism, bilateral: Secondary | ICD-10-CM | POA: Diagnosis not present

## 2024-03-27 ENCOUNTER — Ambulatory Visit: Admitting: Student in an Organized Health Care Education/Training Program
